# Patient Record
Sex: Female | Born: 1945 | Race: White | Hispanic: No | Marital: Married | Smoking: Former smoker
Health system: Southern US, Community
[De-identification: ages and names within clinical notes are randomized; demographics above are authoritative.]

## PROBLEM LIST (undated history)

## (undated) DIAGNOSIS — C801 Malignant (primary) neoplasm, unspecified: Secondary | ICD-10-CM

## (undated) DIAGNOSIS — J449 Chronic obstructive pulmonary disease, unspecified: Secondary | ICD-10-CM

## (undated) DIAGNOSIS — J42 Unspecified chronic bronchitis: Secondary | ICD-10-CM

## (undated) DIAGNOSIS — F329 Major depressive disorder, single episode, unspecified: Secondary | ICD-10-CM

## (undated) DIAGNOSIS — R0600 Dyspnea, unspecified: Secondary | ICD-10-CM

## (undated) DIAGNOSIS — I10 Essential (primary) hypertension: Secondary | ICD-10-CM

## (undated) DIAGNOSIS — M1990 Unspecified osteoarthritis, unspecified site: Secondary | ICD-10-CM

## (undated) DIAGNOSIS — F32A Depression, unspecified: Secondary | ICD-10-CM

## (undated) DIAGNOSIS — C50919 Malignant neoplasm of unspecified site of unspecified female breast: Secondary | ICD-10-CM

## (undated) HISTORY — PX: SPLENECTOMY, TOTAL: SHX788

## (undated) HISTORY — DX: Depression, unspecified: F32.A

## (undated) HISTORY — PX: ABDOMINAL HYSTERECTOMY: SHX81

## (undated) HISTORY — PX: CHOLECYSTECTOMY: SHX55

## (undated) HISTORY — PX: KNEE SURGERY: SHX244

## (undated) HISTORY — PX: TONSILLECTOMY: SUR1361

## (undated) HISTORY — DX: Major depressive disorder, single episode, unspecified: F32.9

## (undated) HISTORY — PX: ELBOW LIGAMENT RECONSTRUCTION: SHX6359

## (undated) HISTORY — PX: BREAST SURGERY: SHX581

---

## 2002-11-07 ENCOUNTER — Inpatient Hospital Stay (HOSPITAL_COMMUNITY): Admission: RE | Admit: 2002-11-07 | Discharge: 2002-11-09 | Payer: Self-pay | Admitting: Orthopedic Surgery

## 2008-06-24 ENCOUNTER — Ambulatory Visit: Payer: Self-pay | Admitting: Family Medicine

## 2008-06-24 DIAGNOSIS — D1739 Benign lipomatous neoplasm of skin and subcutaneous tissue of other sites: Secondary | ICD-10-CM | POA: Insufficient documentation

## 2008-06-24 DIAGNOSIS — I10 Essential (primary) hypertension: Secondary | ICD-10-CM | POA: Insufficient documentation

## 2011-01-05 NOTE — Op Note (Signed)
NAME:  Melanie Flores, Melanie Flores                      ACCOUNT NO.:  [id number]   MEDICAL RECORD NO.:  [id number]                   PATIENT TYPE:  INP   LOCATION:  NA                                   FACILITY:  [facility]   PHYSICIAN:  Dionne Ano. Everlene Other, M.D.         DATE OF BIRTH:  01-Nov-1945   DATE OF PROCEDURE:  DATE OF DISCHARGE:                                 OPERATIVE REPORT   PREOPERATIVE DIAGNOSES:  Right elbow terrible triad fracture with radial  head fracture, ulna fracture intra-articularly including the coronoid, and a  dislocated elbow.   POSTOPERATIVE DIAGNOSES:  Right elbow terrible triad fracture with radial  head fracture, ulna fracture intra-articularly including the coronoid, and a  dislocated elbow.   PROCEDURES:  1. Open reduction, right elbow dislocation (terrible triad elbow fracture-     dislocation).  2. Open reduction and internal fixation, right comminuted complex intra-     articular ulna fracture, with coronoid separate fragment noted.  3. Open reduction and internal fixation, radial head fracture, with headless     compression screws.  4. In-situ ulnar nerve decompression and evaluation (cubital tunnel     release).  5. Stress radiography, right elbow.   SURGEON:  Dionne Ano. Amanda Pea, M.D.   ASSISTANT:  Karie Chimera, P.A.-C.   TOURNIQUET TIME:  1 hour 20 minutes.   DRAINS:  None.   ESTIMATED BLOOD LOSS:  Minimal.   COMPLICATIONS:  None.   ANESTHESIA:  General anesthetic.   INDICATION FOR PROCEDURE:  This patient is a 65 year old female who  presented to my office earlier this week.  The patient had fallen at [facility], [place], and sustained a terrible triad elbow fracture (elbow  dislocation, ulna fracture, shaft including a separate coronoid piece, as  well as a radial head fracture).  Following her fracture she was  preliminarily reduced in [facility].  She was told she would require  surgery, and consideration was made for it  to be done in [facility];  however, she elected to come home to her residence and have treatment  performed.  She presented to our office this week.  X-rays taken in our  office showed recurrence of her dislocation.  The previous films from [facility] showed reduction, which was done immediately after splint application.  I counseled the patient in regard to her upper extremity predicament and  discussed with her that this is a very difficult fracture.  Unfortunately,  she has a coronoid fracture which is separate from the ulnar shaft itself,  which is splintered.  She also has a radial head fracture and instability  about the elbow.  I have discussed with her that this will require surgical  intervention in the form of ORIF and possible radial head replacement versus  ORIF of the radial head.  I have gone over with her risks and benefits of  surgery, including risks of infection, bleeding, anesthesia, damage to  normal structures, and failure of surgery to accomplish its intended goals  of relieving symptoms and restoring function.  With this in mind, she  desires to proceed.  All questions have been encouraged and answered  preoperatively.   OPERATIVE FINDINGS:  This patient had a terrible triad elbow fracture-  dislocation.  She underwent ORIF without difficulty.  We had excellent  stability achieved through internal fixation about the ulna and radial head  at the conclusion of the case with a smooth arc of motion and no crepitus.  She will be a candidate for early range of motion as she had secure  fixation.  She will be prophylactically given Indocin 75 mg SR x4 weeks to  prevent heterotopic ossification, as this was a delayed ORIF of a fracture-  dislocation which included the radial head.   DESCRIPTION OF PROCEDURE:  The patient was seen by myself and anesthesia.  She was taken to the operative suite and underwent a smooth induction of  general endotracheal anesthesia.  Ancef  was hung preoperatively.  Once in  the operative suite, she was gently positioned in the semi-lateral position,  a bump was built for her arm, and following this she was prepped and draped  in the usual sterile fashion with Betadine scrub and paint.  Once this was  done, the arm was elevated, the tourniquet was inflated to 275 mmHg, and the  patient then underwent a posterior approach to the elbow.  The patient had a  midline posterior approach created.  This was taken down to the fascia and  following this, an interval between the fascia and subcutaneous plane was  created.  I went over to the lateral side of the elbow to the interval past  the anconeus and ECU to gain access to the radial head at a point in the  case.  Medially this swung over to the medial epicondyle.  Full-thickness  flaps were elevated.  Once this was done, the patient had the ulnar nerve  identified and this underwent an in-situ release.  Thus cubital tunnel  release was performed in situ.  The ulnar nerve was significantly contused  proximal and within the cubital canal.  The two heads of the FCU, cubital  tunnel, and ________ fascia were released as well as portions of the medial  intermuscular septum.  The patient tolerated this well.  The nerve sat  nicely and was protected throughout the case.  Once this was done, attention  was turned toward the ulna fracture.  The patient had meticulous dissection  and evaluation of the fracture.  I very carefully identified the fracture  and the intra-articular component.  The bone giblets in the proximal  radioulnar joint were identified and any loose fragments as well as  cartilage were removed.  The coronoid fracture was identified and a  reduction was accomplished.  Similarly, after combination curette, lavage,  gentle periosteal elevation, and fracture preparation, the ulna shaft was also reduced.  Provisional fixation was applied without difficulty and  following this,  an Acumed ulnar plate was applied.  The patient had screws  engaged in standard AO technique, and I did achieve excellent purchase about  the coronoid fracture.  Following achieving a good fixation and application  of the plate, provisional fixation was removed and I placed the arm through  a range of motion.   I should note that prior to gaining stability about the ulna, the elbow did  have to undergo a reduction procedure as  the radial head was dislocated  posteriorly.  The patient did have eburnation of bone about the capitellum  and scuff injury from the radial head fracture.  This was reduced and during  the preparation of the ulna fracture, the elbow reduction was accomplished  and the radial head placed into a reduced position with the fracture  fragments noted.   Once the ORIF of the ulna had been accomplished to my satisfaction without  difficulty, the patient had the fascial tissue repaired over the plate with  0 Vicryl.  Access was then gained to the radial head between the interval  between the ECU and anconeus.  We very carefully pronated the arm to protect  the radial nerve and following this gained access through the anconeus-ECU  interval to the radial head, which was fractured.  This was a comminuted  fracture.  We very gently and delicately lifted the edges and a somewhat  greater than one-third portion of the radial head fracture which was loose  was then provisionally fixed to the main fragment.  Two small K-wires were  used for this.  Once this was done, the patient then had two headless screws  from [facility] placed.  This was done in standard technique.  The  headless screws were size 16 and 18.  They filled the radial head nicely,  and this was checked under image, of course, to make sure that there were no  protruding ends.  They were inserted within the safe zone; however, being  that they were headless and contained within the bone, this was not a high   concern.  The patient at this point in time had full range of motion  including pronation and supination evaluated.  There was no clicking,  popping, and excellent range of motion with good stability.  I then brought  in the fluoroscopy machine and under fluoroscopy checked the elbow.  It had  good stability with full flexion, extension to 10 degrees, and was noted to  have full pronation and supination with excellent stability.  Stress  radiography revealed that this lady will be a candidate for early range of  motion in my opinion.  Once the patient had this performed, we then deflated  the tourniquet, obtained hemostasis, and copiously irrigated all wounds.  The ECU-anconeus interval was closed nicely with 0 Vicryl, followed by  closure of the subcu with 2-0 Vicryl and closure of the skin edge with  staples.  Marcaine without epinephrine was placed in the wound for postop analgesia.  The patient was then placed in a posterior plaster splint with  the elbow at 90 degrees.  She tolerated the procedure well.  She will be  monitored closely in the postoperative period.   She will be admitted for IV antibiotics, pain control, elevation, finger  range of motion, edema control, and postoperative measures.  Will pursue an  aggressive course at trying to restore this patient's range of motion and  proceed accordingly.  It has been a pleasure to participate in her care.                                               Dionne Ano. Everlene Other, M.D.    Nash Mantis  D:  11/07/2002  T:  11/09/2002  Job:  [id number]   cc:   Almedia Balls. Ranell Patrick, M.D.  [address]  [place]  [place]  [address]  Fax: [phone]   [place], [place] Trilby Drummer, MD, [address]

## 2015-11-09 ENCOUNTER — Encounter: Payer: Self-pay | Admitting: *Deleted

## 2015-11-09 ENCOUNTER — Emergency Department (INDEPENDENT_AMBULATORY_CARE_PROVIDER_SITE_OTHER): Payer: Medicare Other

## 2015-11-09 ENCOUNTER — Emergency Department (INDEPENDENT_AMBULATORY_CARE_PROVIDER_SITE_OTHER)
Admission: EM | Admit: 2015-11-09 | Discharge: 2015-11-09 | Disposition: A | Discharge status: Home / Self Care | Payer: Medicare Other | Source: Home / Self Care | Attending: Family Medicine | Admitting: Family Medicine

## 2015-11-09 DIAGNOSIS — M79672 Pain in left foot: Secondary | ICD-10-CM | POA: Diagnosis not present

## 2015-11-09 DIAGNOSIS — M7732 Calcaneal spur, left foot: Secondary | ICD-10-CM

## 2015-11-09 HISTORY — DX: Malignant (primary) neoplasm, unspecified: C80.1

## 2015-11-09 HISTORY — DX: Essential (primary) hypertension: I10

## 2015-11-09 NOTE — ED Notes (Signed)
Pt c/o LT foot pain, swelling and redness x 1 day. Denies injury.

## 2015-11-09 NOTE — ED Provider Notes (Signed)
CSN: [id number]     Arrival date & time 11/09/15  1143 History   First MD Initiated Contact with Patient 11/09/15 1253     Chief Complaint  Patient presents with  . Foot Pain      HPI Comments: While walking yesterday, patient felt a cramping sensation in the dorsum of her left foot.  Later, she had increased pain that has persisted.  She has also noted swelling and redness in the dorsum of her foot.  She recalls no injury.  No fevers, chills, and sweats.  She feels well otherwise. No past history of gout. She takes HCTZ for hypertension.  Patient is a 70 y.o. female presenting with lower extremity pain. The history is provided by the patient.  Foot Pain This is a new problem. The current episode started yesterday. The problem occurs constantly. The problem has been gradually worsening. Associated symptoms comments: No fevers, chills, and sweats . The symptoms are aggravated by walking. Nothing relieves the symptoms. She has tried nothing for the symptoms.    Past Medical History  Diagnosis Date  . Hypertension   . Cancer ([place])     ovarian, breast   Past Surgical History  Procedure Laterality Date  . Cesarean section    . Splenectomy, total    . Cholecystectomy    . Abdominal hysterectomy     History reviewed. No pertinent family history. Social History  Substance Use Topics  . Smoking status: Current Every Day Smoker -- 0.50 packs/day    Types: Cigarettes  . Smokeless tobacco: None  . Alcohol Use: Yes   OB History    No data available     Review of Systems  All other systems reviewed and are negative.   Allergies  Morphine and related  Home Medications   Prior to Admission medications   Medication Sig Start Date End Date Taking? Authorizing Provider  Cholecalciferol (VITAMIN D-3 PO) Take by mouth.   Yes Historical Provider, MD  hydrochlorothiazide (MICROZIDE) 12.5 MG capsule Take 12.5 mg by mouth daily.   Yes Historical Provider, MD  metoprolol succinate  (TOPROL-XL) 25 MG 24 hr tablet Take 25 mg by mouth daily.   Yes Historical Provider, MD  MULTIPLE VITAMIN PO Take by mouth.   Yes Historical Provider, MD   Meds Ordered and Administered this Visit  Medications - No data to display  BP 129/84 mmHg  Pulse 64  Temp(Src) 98.1 F (36.7 C) (Oral)  Resp 16  Ht 5\' 5"  (1.651 m)  Wt 138 lb (62.596 kg)  BMI 22.96 kg/m2  SpO2 97% No data found.   Physical Exam  Constitutional: She is oriented to person, place, and time. She appears well-developed and well-nourished. No distress.  Eyes: Pupils are equal, round, and reactive to light.  Cardiovascular: Normal heart sounds.   Pulmonary/Chest: Breath sounds normal.  Musculoskeletal:       Left foot: There is decreased range of motion, tenderness, bony tenderness and swelling. There is normal capillary refill, no crepitus and no deformity.       Feet:  Distinct tenderness, swelling, and erythema over the first MTP joint.  Decreased range of motion first MTP joint.  Distal neurovascular function is intact.   Lymphadenopathy:    She has no cervical adenopathy.  Neurological: She is alert and oriented to person, place, and time.  Skin: Skin is warm and dry.  Nursing note and vitals reviewed.   ED Course  Procedures none    Imaging Review Dg Foot  Complete Left  11/09/2015  CLINICAL DATA:  Pain medially for 1 day. EXAM: LEFT FOOT - COMPLETE 3+ VIEW COMPARISON:  None. FINDINGS: Frontal, oblique, and lateral views were obtained. There is no acute fracture or dislocation. There is moderate osteoarthritic change in the first MTP joint with bony overgrowth along the lateral aspect proximal portion of the first proximal phalanx. There is mild osteoarthritic change in the third PIP joint. There is a minimal inferior calcaneal spur. There is a prominent accessory ossicle lateral to the cuboid. No erosive change. 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 No acute fracture or  dislocation. Minimal inferior calcaneal spur. Electronically Signed   By: Lowella Grip III M.D.   On: 11/09/2015 13:35      MDM   1. Left foot pain; suspect acute gout   Patient declines Rx for prednisone. May take Ibuprofen 200mg , 4 tabs every 6 to 8 hours with food. Followup with Family Doctor if not improved in one week.     Kandra Nicolas, MD 11/17/15 1052

## 2015-11-09 NOTE — Discharge Instructions (Signed)
May take Ibuprofen 200mg , 4 tabs every 6 to 8 hours with food.

## 2016-11-16 ENCOUNTER — Emergency Department
Admission: EM | Admit: 2016-11-16 | Discharge: 2016-11-16 | Disposition: A | Discharge status: Home / Self Care | Payer: Medicare Other | Source: Home / Self Care | Attending: Family Medicine | Admitting: Family Medicine

## 2016-11-16 ENCOUNTER — Encounter: Payer: Self-pay | Admitting: *Deleted

## 2016-11-16 DIAGNOSIS — J069 Acute upper respiratory infection, unspecified: Secondary | ICD-10-CM

## 2016-11-16 DIAGNOSIS — B9789 Other viral agents as the cause of diseases classified elsewhere: Secondary | ICD-10-CM

## 2016-11-16 DIAGNOSIS — J9801 Acute bronchospasm: Secondary | ICD-10-CM

## 2016-11-16 HISTORY — DX: Malignant neoplasm of unspecified site of unspecified female breast: C50.919

## 2016-11-16 HISTORY — DX: Chronic obstructive pulmonary disease, unspecified: J44.9

## 2016-11-16 HISTORY — DX: Unspecified chronic bronchitis: J42

## 2016-11-16 MED ORDER — PREDNISONE 20 MG PO TABS: ORAL_TABLET | ORAL | 0 refills | Status: DC

## 2016-11-16 MED ORDER — BENZONATATE 200 MG PO CAPS: ORAL_CAPSULE | ORAL | 0 refills | Status: DC

## 2016-11-16 MED ORDER — IPRATROPIUM-ALBUTEROL 0.5-2.5 (3) MG/3ML IN SOLN
3.0000 mL | Freq: Four times a day (QID) | RESPIRATORY_TRACT | Status: DC
  Administered 2016-11-16: 3 mL via RESPIRATORY_TRACT

## 2016-11-16 MED ORDER — METHYLPREDNISOLONE SODIUM SUCC 125 MG IJ SOLR
80.0000 mg | Freq: Once | INTRAMUSCULAR | Status: AC
  Administered 2016-11-16: 80 mg via INTRAMUSCULAR

## 2016-11-16 NOTE — ED Provider Notes (Signed)
[name]    CSN: [id number] Arrival date & time: 11/16/16  1129     History   Chief Complaint Chief Complaint  Patient presents with  . Cough    HPI Melanie Flores is a 71 y.o. female.   About one week ago patient developed typical cold-like symptoms developing over several days, including mild sore throat, sinus congestion, myalgias, fatigue, and cough.  She has now developed wheezing and increased shortness of breath with activity. She has a history of COPD and chronic bronchitis.   The history is provided by the patient.    Past Medical History:  Diagnosis Date  . Breast cancer ([place])   . Cancer ([place])    ovarian, breast  . Chronic bronchitis ([place])   . COPD (chronic obstructive pulmonary disease) ([place])   . Hypertension     Patient Active Problem List   Diagnosis Date Noted  . 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 HYPERTENSION 06/24/2008    Past Surgical History:  Procedure Laterality Date  . ABDOMINAL HYSTERECTOMY    . BREAST SURGERY    . CESAREAN SECTION    . CHOLECYSTECTOMY    . SPLENECTOMY, TOTAL      OB History    No data available       Home Medications    Prior to Admission medications   Medication Sig Start Date End Date Taking? Authorizing Provider  albuterol (PROVENTIL HFA;VENTOLIN HFA) 108 (90 Base) MCG/ACT inhaler Inhale into the lungs every 6 (six) hours as needed for wheezing or shortness of breath.   Yes Historical Provider, MD  B Complex Vitamins (VITAMIN-B COMPLEX PO) Take by mouth.   Yes Historical Provider, MD  calcium carbonate 1250 MG capsule Take 1,250 mg by mouth 2 (two) times daily with a meal.   Yes Historical Provider, MD  Cholecalciferol (VITAMIN D-3 PO) Take by mouth.   Yes Historical Provider, MD  MELATONIN PO Take by mouth.   Yes Historical Provider, MD  METOPROLOL TARTRATE PO Take by mouth.   Yes Historical Provider, MD  MULTIPLE VITAMIN PO Take by mouth.   Yes Historical Provider, MD  senna (SENOKOT) 8.6 MG  tablet Take 1 tablet by mouth daily.   Yes Historical Provider, MD  tiotropium (SPIRIVA) 18 MCG inhalation capsule Place 18 mcg into inhaler and inhale daily.   Yes Historical Provider, MD  benzonatate (TESSALON) 200 MG capsule Take one cap by mouth at bedtime as needed for cough.  May repeat in 4 to 6 hours 11/16/16   Kandra Nicolas, MD  hydrochlorothiazide (MICROZIDE) 12.5 MG capsule Take 12.5 mg by mouth daily.    Historical Provider, MD  metoprolol succinate (TOPROL-XL) 25 MG 24 hr tablet Take 25 mg by mouth daily.    Historical Provider, MD  predniSONE (DELTASONE) 20 MG tablet Take one tab by mouth twice daily for 5 days, then one daily for 3 days. Take with food. 11/16/16   Kandra Nicolas, MD    Family History History reviewed. No pertinent family history.  Social History Social History  Substance Use Topics  . Smoking status: Current Every Day Smoker    Packs/day: 0.50    Types: Cigarettes  . Smokeless tobacco: Never Used  . Alcohol use Yes     Allergies   Chantix [varenicline]; Levaquin [levofloxacin]; and Morphine and related   Review of Systems Review of Systems + sore throat + cough No pleuritic pain + wheezing + nasal congestion + post-nasal drainage No sinus pain/pressure No  itchy/red eyes No earache No hemoptysis + SOB No fever/chills No nausea No vomiting No abdominal pain No diarrhea No urinary symptoms No skin rash + fatigue + myalgias No headache Used OTC meds without relief   Physical Exam Triage Vital Signs ED Triage Vitals  Enc Vitals Group     BP 11/16/16 1254 (!) 146/87     Pulse Rate 11/16/16 1254 75     Resp 11/16/16 1254 16     Temp 11/16/16 1254 97.9 F (36.6 C)     Temp Source 11/16/16 1254 Oral     SpO2 11/16/16 1254 95 %     Weight 11/16/16 1255 141 lb (64 kg)     Height 11/16/16 1255 5\' 5"  (1.651 m)     Head Circumference --      Peak Flow --      Pain Score 11/16/16 1255 0     Pain Loc --      Pain Edu? --      Excl.  in [place]? --    No data found.   Updated Vital Signs BP (!) 146/87 (BP Location: Left Arm)   Pulse 75   Temp 97.9 F (36.6 C) (Oral)   Resp 16   Ht 5\' 5"  (1.651 m)   Wt 141 lb (64 kg)   SpO2 95%   BMI 23.46 kg/m   Visual Acuity Right Eye Distance:   Left Eye Distance:   Bilateral Distance:    Right Eye Near:   Left Eye Near:    Bilateral Near:     Physical Exam Nursing notes and Vital Signs reviewed. Appearance:  Patient appears stated age, and in no acute distress Eyes:  Pupils are equal, round, and reactive to light and accomodation.  Extraocular movement is intact.  Conjunctivae are not inflamed  Ears:  Canals normal.  Tympanic membranes normal.  Nose:  Mildly congested turbinates.  No sinus tenderness.  Pharynx:  Normal Neck:  Supple.  Tender enlarged posterior/lateral nodes are palpated bilaterally  Lungs:  Bibasilar expiratory wheezes present.  Breath sounds are equal.  Moving air well. Heart:  Regular rate and rhythm without murmurs, rubs, or gallops.  Abdomen:  Nontender without masses or hepatosplenomegaly.  Bowel sounds are present.  No CVA or flank tenderness.  Extremities:  No edema.  Skin:  No rash present.    UC Treatments / Results  Labs (all labs ordered are listed, but only abnormal results are displayed) Labs Reviewed - No data to display  EKG  EKG Interpretation None       Radiology No results found.  Procedures Procedures (including critical care time)  Medications Ordered in UC Medications  ipratropium-albuterol (DUONEB) 0.5-2.5 (3) MG/3ML nebulizer solution 3 mL (3 mLs Nebulization Given 11/16/16 1318)  methylPREDNISolone sodium succinate (SOLU-MEDROL) 125 mg/2 mL injection 80 mg (not administered)     Initial Impression / Assessment and Plan / UC Course  I have reviewed the triage vital signs and the nursing notes.  Pertinent labs & imaging results that were available during my care of the patient were reviewed by me and  considered in my medical decision making (see chart for details).    Administered DuoNeb by hand held nebulizer  Administered Solumedrol 80mg  IM. Prescription written for Benzonatate [facility]) to take at bedtime for night-time cough.  Begin prednisone burst/taper tomorrow (Saturday, 11/17/16). Begin doxycycline 100mg  BID. Continue Spiriva and albuterol inhalers. Take plain guaifenesin (1200mg  extended release tabs such as Mucinex) twice daily, with plenty of  water, for cough and congestion.  Get adequate rest.   May use Afrin nasal spray (or generic oxymetazoline) each morning for about 5 days and then discontinue.  Also recommend using saline nasal spray several times daily and saline nasal irrigation (AYR is a common brand).  Use Flonase nasal spray each morning after using Afrin nasal spray and saline nasal irrigation. Try warm salt water gargles for sore throat.  Stop all antihistamines for now, and other non-prescription cough/cold preparations.   Follow-up with family doctor if not improving about 7 to10 days.    Final Clinical Impressions(s) / UC Diagnoses   Final diagnoses:  Viral URI with cough  Bronchospasm, acute    New Prescriptions New Prescriptions   BENZONATATE (TESSALON) 200 MG CAPSULE    Take one cap by mouth at bedtime as needed for cough.  May repeat in 4 to 6 hours   PREDNISONE (DELTASONE) 20 MG TABLET    Take one tab by mouth twice daily for 5 days, then one daily for 3 days. Take with food.     Kandra Nicolas, MD 11/26/16 1308

## 2016-11-16 NOTE — ED Triage Notes (Signed)
Pt c/o productive cough x 1 wk, with wheezing x 1 day. Denies fever. She has taken Mucinex, [employer], and [employer] Hx of COPD and Chronic Bronchitis.

## 2016-11-16 NOTE — Discharge Instructions (Signed)
Begin prednisone tomorrow (Saturday, 11/17/16). Begin doxycycline 100mg  BID. Continue Spiriva and albuterol inhalers. Take plain guaifenesin (1200mg  extended release tabs such as Mucinex) twice daily, with plenty of water, for cough and congestion.  Get adequate rest.   May use Afrin nasal spray (or generic oxymetazoline) each morning for about 5 days and then discontinue.  Also recommend using saline nasal spray several times daily and saline nasal irrigation (AYR is a common brand).  Use Flonase nasal spray each morning after using Afrin nasal spray and saline nasal irrigation. Try warm salt water gargles for sore throat.  Stop all antihistamines for now, and other non-prescription cough/cold preparations.   Follow-up with family doctor if not improving about 7 to10 days.

## 2017-03-24 ENCOUNTER — Encounter: Payer: Self-pay | Admitting: Emergency Medicine

## 2017-03-24 ENCOUNTER — Emergency Department
Admission: EM | Admit: 2017-03-24 | Discharge: 2017-03-24 | Disposition: A | Discharge status: Home / Self Care | Payer: Medicare Other | Source: Home / Self Care | Attending: Family Medicine | Admitting: Family Medicine

## 2017-03-24 DIAGNOSIS — L304 Erythema intertrigo: Secondary | ICD-10-CM | POA: Diagnosis not present

## 2017-03-24 MED ORDER — FUROSEMIDE 20 MG PO TABS: ORAL_TABLET | ORAL | 0 refills | Status: DC

## 2017-03-24 MED ORDER — BUTALBITAL-APAP-CAFFEINE 50-325-40 MG PO TABS
1.0000 | ORAL_TABLET | Freq: Four times a day (QID) | ORAL | 0 refills | Status: DC | PRN
Start: 2017-03-24 — End: 2017-03-24

## 2017-03-24 MED ORDER — KETOCONAZOLE 2 % EX CREA: 1.0000 "application " | TOPICAL_CREAM | Freq: Two times a day (BID) | CUTANEOUS | 0 refills | Status: DC

## 2017-03-24 NOTE — ED Triage Notes (Signed)
Patient presents to [facility] with complaint of rash under Right Breast, patient states that it feels like a poking pain

## 2017-03-24 NOTE — Discharge Instructions (Signed)
May apply 1% hydrocortisone cream two or three times daily as needed for itching.

## 2017-03-24 NOTE — ED Provider Notes (Signed)
[name]    CSN: [id number] Arrival date & time: 03/24/17  1114     History   Chief Complaint Chief Complaint  Patient presents with  . Rash    HPI Melanie Flores is a 71 y.o. female.   Patient complains of pruritic rash under right breast for two days, mildly painful.  She has been applying nystatin cream.   The history is provided by the patient.  Rash  Location: beneath right breast. Quality: dryness, itchiness, painful and redness   Quality: not blistering, not bruising, not burning, not draining, not peeling, not scaling, not swelling and not weeping   Pain details:    Quality:  Aching and itching   Severity:  Mild   Onset quality:  Gradual   Duration:  2 days   Timing:  Constant   Progression:  Worsening Severity:  Mild Onset quality:  Gradual Duration:  2 days Timing:  Constant Progression:  Worsening Chronicity:  New Context: not animal contact, not chemical exposure, not exposure to similar rash, not food, not hot tub use, not insect bite/sting, not medications, not new detergent/soap and not plant contact   Relieved by:  Nothing Worsened by:  Nothing Ineffective treatments:  Anti-fungal cream Associated symptoms: fatigue and myalgias   Associated symptoms: no abdominal pain, no diarrhea, no fever, no headaches, no induration, no joint pain, no nausea, no shortness of breath, no sore throat and no URI     Past Medical History:  Diagnosis Date  . Breast cancer ([place])   . Cancer ([place])    ovarian, breast  . Chronic bronchitis ([place])   . COPD (chronic obstructive pulmonary disease) ([place])   . Hypertension     Patient Active Problem List   Diagnosis Date Noted  . 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 HYPERTENSION 06/24/2008    Past Surgical History:  Procedure Laterality Date  . ABDOMINAL HYSTERECTOMY    . BREAST SURGERY    . CESAREAN SECTION    . CHOLECYSTECTOMY    . SPLENECTOMY, TOTAL      OB History    No data available        Home Medications    Prior to Admission medications   Medication Sig Start Date End Date Taking? Authorizing Provider  albuterol (PROVENTIL HFA;VENTOLIN HFA) 108 (90 Base) MCG/ACT inhaler Inhale into the lungs every 6 (six) hours as needed for wheezing or shortness of breath.    [provider]  B Complex Vitamins (VITAMIN-B COMPLEX PO) Take by mouth.    [provider]  benzonatate (TESSALON) 200 MG capsule Take one cap by mouth at bedtime as needed for cough.  May repeat in 4 to 6 hours 11/16/16   Kandra Nicolas, MD  calcium carbonate 1250 MG capsule Take 1,250 mg by mouth 2 (two) times daily with a meal.    [provider]  Cholecalciferol (VITAMIN D-3 PO) Take by mouth.    [provider]  hydrochlorothiazide (MICROZIDE) 12.5 MG capsule Take 12.5 mg by mouth daily.    [provider]  MELATONIN PO Take by mouth.    [provider]  metoprolol succinate (TOPROL-XL) 25 MG 24 hr tablet Take 25 mg by mouth daily.    [provider]  METOPROLOL TARTRATE PO Take by mouth.    [provider]  MULTIPLE VITAMIN PO Take by mouth.    [provider]  predniSONE (DELTASONE) 20 MG tablet Take one tab by mouth twice daily for 5  days, then one daily for 3 days. Take with food. 11/16/16   Kandra Nicolas, MD  senna (SENOKOT) 8.6 MG tablet Take 1 tablet by mouth daily.    [provider]  tiotropium (SPIRIVA) 18 MCG inhalation capsule Place 18 mcg into inhaler and inhale daily.    [provider]    Family History History reviewed. No pertinent family history.  Social History Social History  Substance Use Topics  . Smoking status: Current Every Day Smoker    Packs/day: 0.50    Types: Cigarettes  . Smokeless tobacco: Never Used  . Alcohol use Yes     Comment: 1/2 case per week      Allergies   Chantix [varenicline]; Levaquin [levofloxacin]; and Morphine and related   Review of  Systems Review of Systems  Constitutional: Positive for fatigue. Negative for fever.  HENT: Negative for sore throat.   Respiratory: Negative for shortness of breath.   Gastrointestinal: Negative for abdominal pain, diarrhea and nausea.  Musculoskeletal: Positive for myalgias. Negative for arthralgias.  Skin: Positive for rash.  Neurological: Negative for headaches.  All other systems reviewed and are negative.    Physical Exam Triage Vital Signs ED Triage Vitals [03/24/17 1138]  Enc Vitals Group     BP (!) 158/87     Pulse Rate (!) 16     Resp 16     Temp 98.1 F (36.7 C)     Temp Source Oral     SpO2 97 %     Weight 142 lb 8 oz (64.6 kg)     Height      Head Circumference      Peak Flow      Pain Score 7     Pain Loc      Pain Edu?      Excl. in [place]?    No data found.   Updated Vital Signs BP (!) 158/87 (BP Location: Left Arm)   Pulse (!) 16   Temp 98.1 F (36.7 C) (Oral)   Resp 16   Wt 142 lb 8 oz (64.6 kg)   SpO2 97%   BMI 23.71 kg/m   Visual Acuity Right Eye Distance:   Left Eye Distance:   Bilateral Distance:    Right Eye Near:   Left Eye Near:    Bilateral Near:     Physical Exam  Constitutional: She appears well-developed and well-nourished.  HENT:  Head: Normocephalic.  Eyes: Pupils are equal, round, and reactive to light.  Cardiovascular: Normal rate.   Pulmonary/Chest: Effort normal. She exhibits no tenderness.    In the intertriginous area beneath right breast there is macular erythema with well defined margin as noted on diagram.  No warmth or tenderness to palpation.    Neurological: She is alert.  Skin: Skin is warm and dry.  Nursing note and vitals reviewed.    UC Treatments / Results  Labs (all labs ordered are listed, but only abnormal results are displayed) Labs Reviewed -   POCT KOH prep:  Fungal elements present  EKG  EKG Interpretation None       Radiology No results found.  Procedures Procedures   Performed skin scraping:  With sterile technique, used #18 gage needle to scrape margin of lesion for microscopic exam.  Medications Ordered in UC Medications - No data to display   Initial Impression / Assessment and Plan / UC Course  I have reviewed the triage vital signs and the nursing notes.  Pertinent  labs & imaging results that were available during my care of the patient were reviewed by me and considered in my medical decision making (see chart for details).    Begin Nizoral cream BID. May apply 1% hydrocortisone cream two or three times daily as needed for itching. Followup with dermatologist if not resolved about 10 days.    Final Clinical Impressions(s) / UC Diagnoses   Final diagnoses:  Intertrigo    New Prescriptions Current Discharge Medication List       Kandra Nicolas, MD 04/05/17 [phone]

## 2017-03-27 ENCOUNTER — Telehealth: Payer: Self-pay

## 2017-03-27 NOTE — Telephone Encounter (Signed)
Left VM to call UC if any questions or concerns.

## 2017-09-04 ENCOUNTER — Other Ambulatory Visit: Payer: Self-pay

## 2017-09-04 ENCOUNTER — Emergency Department
Admission: EM | Admit: 2017-09-04 | Discharge: 2017-09-04 | Disposition: A | Discharge status: Home / Self Care | Payer: Medicare Other | Source: Home / Self Care | Attending: Family Medicine | Admitting: Family Medicine

## 2017-09-04 ENCOUNTER — Encounter: Payer: Self-pay | Admitting: Emergency Medicine

## 2017-09-04 DIAGNOSIS — H5789 Other specified disorders of eye and adnexa: Secondary | ICD-10-CM | POA: Diagnosis not present

## 2017-09-04 DIAGNOSIS — J069 Acute upper respiratory infection, unspecified: Secondary | ICD-10-CM

## 2017-09-04 MED ORDER — OLOPATADINE HCL 0.1 % OP SOLN: 1.0000 [drp] | Freq: Two times a day (BID) | OPHTHALMIC | 0 refills | Status: DC

## 2017-09-04 NOTE — ED Provider Notes (Addendum)
[name]    CSN: [id number] Arrival date & time: 09/04/17  0932     History   Chief Complaint Chief Complaint  Patient presents with  . Eye Problem    HPI Melanie Flores is a 72 y.o. female.   HPI  Melanie Flores is a 72 y.o. female presenting to UC with c/o bilateral eye itching, burning and watering for 3 days with associated nasal congestion and mild SOB. Hx of COPD. She uses her [employer] daily but has not tried her albuterol inhaler. She had some chills yesterday but no recorded fever.  Denies n/v/d. She has not tried anything for her eye symptoms. Her husband is also at [facility] to be seen for eye irritation.    Past Medical History:  Diagnosis Date  . Breast cancer ([place])   . Cancer ([place])    ovarian, breast  . Chronic bronchitis ([place])   . COPD (chronic obstructive pulmonary disease) ([place])   . Hypertension     Patient Active Problem List   Diagnosis Date Noted  . 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 HYPERTENSION 06/24/2008    Past Surgical History:  Procedure Laterality Date  . ABDOMINAL HYSTERECTOMY    . BREAST SURGERY    . CESAREAN SECTION    . CHOLECYSTECTOMY    . SPLENECTOMY, TOTAL      OB History    No data available       Home Medications    Prior to Admission medications   Medication Sig Start Date End Date Taking? Authorizing Provider  albuterol (PROVENTIL HFA;VENTOLIN HFA) 108 (90 Base) MCG/ACT inhaler Inhale into the lungs every 6 (six) hours as needed for wheezing or shortness of breath.    [provider]  B Complex Vitamins (VITAMIN-B COMPLEX PO) Take by mouth.    [provider]  benzonatate (TESSALON) 200 MG capsule Take one cap by mouth at bedtime as needed for cough.  May repeat in 4 to 6 hours 11/16/16   Kandra Nicolas, MD  calcium carbonate 1250 MG capsule Take 1,250 mg by mouth 2 (two) times daily with a meal.    [provider]  Cholecalciferol (VITAMIN D-3 PO) Take by mouth.    [provider]  hydrochlorothiazide (MICROZIDE) 12.5 MG capsule Take 12.5 mg by mouth daily.    [provider]  ketoconazole (NIZORAL) 2 % cream Apply 1 application topically 2 (two) times daily. 03/24/17   Kandra Nicolas, MD  MELATONIN PO Take by mouth.    [provider]  metoprolol succinate (TOPROL-XL) 25 MG 24 hr tablet Take 25 mg by mouth daily.    [provider]  METOPROLOL TARTRATE PO Take by mouth.    [provider]  MULTIPLE VITAMIN PO Take by mouth.    [provider]  olopatadine (PATANOL) 0.1 % ophthalmic solution Place 1 drop into both eyes 2 (two) times daily. 09/04/17   Noe Gens, PA-C  senna (SENOKOT) 8.6 MG tablet Take 1 tablet by mouth daily.    [provider]  tiotropium (SPIRIVA) 18 MCG inhalation capsule Place 18 mcg into inhaler and inhale daily.    [provider]    Family History No family history on file.  Social History Social History   Tobacco Use  . Smoking status: Current Every Day Smoker    Packs/day: 0.50    Types: Cigarettes  . Smokeless tobacco: Never Used  Substance Use Topics  . Alcohol use: Yes  Comment: 1/2 case per week   . Drug use: No     Allergies   Chantix [varenicline]; Levaquin [levofloxacin]; and Morphine and related   Review of Systems Review of Systems  Constitutional: Positive for chills. Negative for fever.  HENT: Positive for congestion, postnasal drip and rhinorrhea ( mild). Negative for sneezing and sore throat.   Eyes: Positive for pain, discharge (small amount of crusty in the morning), redness and itching.  Respiratory: Positive for cough ( minimal). Negative for shortness of breath.   Neurological: Negative for dizziness, light-headedness and headaches.     Physical Exam Triage Vital Signs ED Triage Vitals  Enc Vitals Group     BP 09/04/17 1002 (!) 170/79     Pulse Rate 09/04/17 1002 61     Resp --      Temp 09/04/17 1002 97.8 F (36.6  C)     Temp Source 09/04/17 1002 Oral     SpO2 09/04/17 1002 99 %     Weight 09/04/17 1003 141 lb (64 kg)     Height 09/04/17 1003 5\' 4"  (1.626 m)     Head Circumference --      Peak Flow --      Pain Score 09/04/17 1003 3     Pain Loc --      Pain Edu? --      Excl. in [place]? --    No data found.  Updated Vital Signs BP (!) 170/79 (BP Location: Right Arm)   Pulse 61   Temp 97.8 F (36.6 C) (Oral)   Ht 5\' 4"  (1.626 m)   Wt 141 lb (64 kg)   SpO2 99%   BMI 24.20 kg/m   Visual Acuity Right Eye Distance: 20/25 Left Eye Distance: 20/100 Bilateral Distance: 20/25(without correction)  Right Eye Near:   Left Eye Near:    Bilateral Near:     Physical Exam  Constitutional: She is oriented to person, place, and time. She appears well-developed and well-nourished. No distress.  HENT:  Head: Normocephalic and atraumatic.  Right Ear: Tympanic membrane normal.  Left Ear: Tympanic membrane normal.  Nose: Nose normal.  Mouth/Throat: Uvula is midline, oropharynx is clear and moist and mucous membranes are normal.  Eyes: Conjunctivae, EOM and lids are normal. Pupils are equal, round, and reactive to light.  Neck: Normal range of motion. Neck supple.  Cardiovascular: Normal rate and regular rhythm.  Pulmonary/Chest: Effort normal and breath sounds normal. No stridor. No respiratory distress. She has no wheezes. She has no rales.  Musculoskeletal: Normal range of motion.  Neurological: She is alert and oriented to person, place, and time.  Skin: Skin is warm and dry. She is not diaphoretic.  Psychiatric: She has a normal mood and affect. Her behavior is normal.  Nursing note and vitals reviewed.    UC Treatments / Results  Labs (all labs ordered are listed, but only abnormal results are displayed) Labs Reviewed - No data to display  EKG  EKG Interpretation None       Radiology No results found.  Procedures Procedures (including critical care time)  Medications Ordered  in UC Medications - No data to display   Initial Impression / Assessment and Plan / UC Course  I have reviewed the triage vital signs and the nursing notes.  Pertinent labs & imaging results that were available during my care of the patient were reviewed by me and considered in my medical decision making (see chart for details).  Left eye visual acuity: 20/100 (due to surgery, "one is far and one is near") Hx and exam c/w viral URI No evidence of bacterial conjunctivitis Will tx symptomatically  F/u with PCP in 1 week if not improving.   Final Clinical Impressions(s) / UC Diagnoses   Final diagnoses:  Eye irritation  Viral upper respiratory illness    ED Discharge Orders        Ordered    olopatadine (PATANOL) 0.1 % ophthalmic solution  2 times daily     09/04/17 1022       Controlled Substance Prescriptions [place] Controlled Substance Registry consulted? Not Applicable   Tyrell Antonio 09/04/17 1112    Noe Gens, [place] 09/04/17 1113

## 2017-09-04 NOTE — ED Triage Notes (Signed)
Bi-lateral eyes burning, itching, watery, stinging x 3 days

## 2017-12-05 ENCOUNTER — Other Ambulatory Visit: Payer: Self-pay

## 2017-12-05 ENCOUNTER — Emergency Department
Admission: EM | Admit: 2017-12-05 | Discharge: 2017-12-05 | Disposition: A | Discharge status: Home / Self Care | Payer: Medicare Other | Source: Home / Self Care | Attending: Emergency Medicine | Admitting: Emergency Medicine

## 2017-12-05 DIAGNOSIS — R21 Rash and other nonspecific skin eruption: Secondary | ICD-10-CM

## 2017-12-05 MED ORDER — BETAMETHASONE DIPROPIONATE AUG 0.05 % EX CREA: TOPICAL_CREAM | Freq: Two times a day (BID) | CUTANEOUS | 0 refills | Status: DC

## 2017-12-05 NOTE — ED Provider Notes (Signed)
[facility]    CSN: [id number] Arrival date & time: 12/05/17  0800     History   Chief Complaint Chief Complaint  Patient presents with  . Rash    HPI Melanie Flores is a 72 y.o. female.  Patient enters with a Cheek complaint of a rash in the center of her chest.  This has been happening since 2012.  Every 3-4 months she develops a red raised rash in the center of her chest and at times beneath her breasts.  This area is extremely pruritic.  She tried Nizoral cream without improvement.  She does not have a history of diabetes.  She has not been to the dermatologist. HPI  Past Medical History:  Diagnosis Date  . Breast cancer ([place])   . Cancer ([place])    ovarian, breast  . Chronic bronchitis ([place])   . COPD (chronic obstructive pulmonary disease) ([place])   . Hypertension     Patient Active Problem List   Diagnosis Date Noted  . 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 HYPERTENSION 06/24/2008    Past Surgical History:  Procedure Laterality Date  . ABDOMINAL HYSTERECTOMY    . BREAST SURGERY    . CESAREAN SECTION    . CHOLECYSTECTOMY    . SPLENECTOMY, TOTAL      OB History   None      Home Medications    Prior to Admission medications   Medication Sig Start Date End Date Taking? Authorizing Provider  albuterol (PROVENTIL HFA;VENTOLIN HFA) 108 (90 Base) MCG/ACT inhaler Inhale into the lungs every 6 (six) hours as needed for wheezing or shortness of breath.    [provider]  B Complex Vitamins (VITAMIN-B COMPLEX PO) Take by mouth.    [provider]  benzonatate (TESSALON) 200 MG capsule Take one cap by mouth at bedtime as needed for cough.  May repeat in 4 to 6 hours 11/16/16   Kandra Nicolas, MD  calcium carbonate 1250 MG capsule Take 1,250 mg by mouth 2 (two) times daily with a meal.    [provider]  Cholecalciferol (VITAMIN D-3 PO) Take by mouth.    [provider]  hydrochlorothiazide (MICROZIDE) 12.5 MG capsule Take  12.5 mg by mouth daily.    [provider]  ketoconazole (NIZORAL) 2 % cream Apply 1 application topically 2 (two) times daily. 03/24/17   Kandra Nicolas, MD  MELATONIN PO Take by mouth.    [provider]  metoprolol succinate (TOPROL-XL) 25 MG 24 hr tablet Take 25 mg by mouth daily.    [provider]  METOPROLOL TARTRATE PO Take by mouth.    [provider]  MULTIPLE VITAMIN PO Take by mouth.    [provider]  olopatadine (PATANOL) 0.1 % ophthalmic solution Place 1 drop into both eyes 2 (two) times daily. 09/04/17   Noe Gens, PA-C  senna (SENOKOT) 8.6 MG tablet Take 1 tablet by mouth daily.    [provider]  tiotropium (SPIRIVA) 18 MCG inhalation capsule Place 18 mcg into inhaler and inhale daily.    [provider]    Family History History reviewed. No pertinent family history.  Social History Social History   Tobacco Use  . Smoking status: Current Every Day Smoker    Packs/day: 0.50    Types: Cigarettes  . Smokeless tobacco: Never Used  Substance Use Topics  . Alcohol use: Yes    Comment: 1/2 case per week   . Drug  use: No     Allergies   Chantix [varenicline]; Levaquin [levofloxacin]; and Morphine and related   Review of Systems Review of Systems  Constitutional: Negative.   Eyes: Negative.   Skin:       Recurrent rash between her breasts.     Physical Exam Triage Vital Signs ED Triage Vitals  Enc Vitals Group     BP 12/05/17 0825 140/89     Pulse Rate 12/05/17 0825 63     Resp --      Temp 12/05/17 0825 97.8 F (36.6 C)     Temp Source 12/05/17 0825 Oral     SpO2 12/05/17 0825 99 %     Weight 12/05/17 0826 139 lb (63 kg)     Height 12/05/17 0826 5\' 5"  (1.651 m)     Head Circumference --      Peak Flow --      Pain Score 12/05/17 0826 0     Pain Loc --      Pain Edu? --      Excl. in [place]? --    No data found.  Updated Vital Signs BP 140/89 (BP Location: Right Arm)   Pulse 63    Temp 97.8 F (36.6 C) (Oral)   Ht 5\' 5"  (1.651 m)   Wt 139 lb (63 kg)   SpO2 99%   BMI 23.13 kg/m   Visual Acuity Right Eye Distance:   Left Eye Distance:   Bilateral Distance:    Right Eye Near:   Left Eye Near:    Bilateral Near:     Physical Exam  Skin:  There is a maculopapular slightly raised rash between her breasts.  This does not extend into the axilla.  This did not fluoresce with Sherral Hammers lamp evaluation.     UC Treatments / Results  Labs (all labs ordered are listed, but only abnormal results are displayed) Labs Reviewed - No data to display  EKG None Radiology No results found.  Procedures Procedures (including critical care time)  Medications Ordered in UC Medications - No data to display   Initial Impression / Assessment and Plan / UC Course  I have reviewed the triage vital signs and the nursing notes.  Pertinent labs & imaging results that were available during my care of the patient were reviewed by me and considered in my medical decision making (see chart for details).     I am not clear what this rash is.  It actually has the appearance of a contact dermatitis but she does not wear an underwire to her bra.  She did not respond to antifungal treatments.  It did not have the classic appearance of yeast.  It also did not fluoresce with the [place] lamp that would say it is erythrasma.  I advised her to be sure her bras did not have an underwire.  She will apply hydrocortisone cream 3 times a day.  She would take Benadryl at night and Claritin or Zyrtec 1 every morning for itching.  I have advised her to follow-up with the dermatologist.  Final Clinical Impressions(s) / UC Diagnoses   Final diagnoses:  None    ED Discharge Orders    None     Apply cream 3 times a day. I would advise you to see the dermatologist for consideration of a skin biopsy to get a definite diagnosis of your recurrent rash. Take Benadryl at night for itching. Take  Claritin 1 every morning to help with the  itching. Be sure that the bras you wear do not have an underwire. When possible would advise wearing white cotton tops.  Controlled Substance Prescriptions [place] Controlled Substance Registry consulted? Not Applicable   Darlyne Russian, MD 12/05/17 [phone]

## 2017-12-05 NOTE — Discharge Instructions (Addendum)
Apply cream 3 times a day. I would advise you to see the dermatologist for consideration of a skin biopsy to get a definite diagnosis of your recurrent rash. Take Benadryl at night for itching. Take Claritin 1 every morning to help with the itching. Be sure that the bras you wear do not have an underwire. When possible would advise wearing white cotton tops.

## 2017-12-05 NOTE — ED Triage Notes (Signed)
Pt has had a rash off and on under her breast.  The last one started this past Friday.

## 2017-12-28 ENCOUNTER — Emergency Department (INDEPENDENT_AMBULATORY_CARE_PROVIDER_SITE_OTHER)
Admission: EM | Admit: 2017-12-28 | Discharge: 2017-12-28 | Disposition: A | Discharge status: Home / Self Care | Payer: Medicare Other | Source: Home / Self Care | Attending: Family Medicine | Admitting: Family Medicine

## 2017-12-28 ENCOUNTER — Encounter: Payer: Self-pay | Admitting: Emergency Medicine

## 2017-12-28 DIAGNOSIS — R310 Gross hematuria: Secondary | ICD-10-CM

## 2017-12-28 LAB — POCT CBC W AUTO DIFF (K'VILLE URGENT CARE)

## 2017-12-28 LAB — POCT URINALYSIS DIP (MANUAL ENTRY)
BILIRUBIN UA: NEGATIVE mg/dL
Glucose, UA: NEGATIVE mg/dL
LEUKOCYTES UA: NEGATIVE
NITRITE UA: NEGATIVE
Spec Grav, UA: 1.025 (ref 1.010–1.025)
Urobilinogen, UA: 0.2 E.U./dL
pH, UA: 7 (ref 5.0–8.0)

## 2017-12-28 MED ORDER — CEPHALEXIN 500 MG PO CAPS: 500.0000 mg | ORAL_CAPSULE | Freq: Two times a day (BID) | ORAL | 0 refills | Status: DC

## 2017-12-28 NOTE — ED Provider Notes (Signed)
[name]    CSN: [id number] Arrival date & time: 12/28/17  1509     History   Chief Complaint Chief Complaint  Patient presents with  . Hematuria    HPI Melanie Flores is a 72 y.o. female.   Patient noticed blood in her urine yesterday, becoming more pronounced today.  She has noticed a vague mild ache in her right back, and mild pelvic pressure, but no dysuria or frequency.  She has had no fevers, chills, and sweats and no nausea/vomiting.  She finished a course of doxycycline 1.5 weeks ago for bronchitis, and feels well otherwise.  No history of kidney stones.   She has a follow-up appointment with her PCP in two days.  The history is provided by the patient.  Hematuria  This is a new problem. The current episode started yesterday. The problem occurs constantly. The problem has been gradually worsening. Pertinent negatives include no chest pain and no abdominal pain. Nothing aggravates the symptoms. Nothing relieves the symptoms. She has tried nothing for the symptoms.    Past Medical History:  Diagnosis Date  . Breast cancer ([place])   . Cancer ([place])    ovarian, breast  . Chronic bronchitis ([place])   . COPD (chronic obstructive pulmonary disease) ([place])   . Hypertension     Patient Active Problem List   Diagnosis Date Noted  . 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 HYPERTENSION 06/24/2008    Past Surgical History:  Procedure Laterality Date  . ABDOMINAL HYSTERECTOMY    . BREAST SURGERY    . CESAREAN SECTION    . CHOLECYSTECTOMY    . SPLENECTOMY, TOTAL      OB History   None      Home Medications    Prior to Admission medications   Medication Sig Start Date End Date Taking? Authorizing Provider  albuterol (PROVENTIL HFA;VENTOLIN HFA) 108 (90 Base) MCG/ACT inhaler Inhale into the lungs every 6 (six) hours as needed for wheezing or shortness of breath.    [provider]  augmented betamethasone dipropionate (DIPROLENE AF) 0.05 % cream Apply  topically 2 (two) times daily. 12/05/17   Darlyne Russian, MD  B Complex Vitamins (VITAMIN-B COMPLEX PO) Take by mouth.    [provider]  benzonatate (TESSALON) 200 MG capsule Take one cap by mouth at bedtime as needed for cough.  May repeat in 4 to 6 hours 11/16/16   Kandra Nicolas, MD  calcium carbonate 1250 MG capsule Take 1,250 mg by mouth 2 (two) times daily with a meal.    [provider]  cephALEXin (KEFLEX) 500 MG capsule Take 1 capsule (500 mg total) by mouth 2 (two) times daily. 12/28/17   Kandra Nicolas, MD  Cholecalciferol (VITAMIN D-3 PO) Take by mouth.    [provider]  hydrochlorothiazide (MICROZIDE) 12.5 MG capsule Take 12.5 mg by mouth daily.    [provider]  ketoconazole (NIZORAL) 2 % cream Apply 1 application topically 2 (two) times daily. 03/24/17   Kandra Nicolas, MD  MELATONIN PO Take by mouth.    [provider]  metoprolol succinate (TOPROL-XL) 25 MG 24 hr tablet Take 25 mg by mouth daily.    [provider]  METOPROLOL TARTRATE PO Take by mouth.    [provider]  MULTIPLE VITAMIN PO Take by mouth.    [provider]  olopatadine (PATANOL) 0.1 % ophthalmic solution Place 1 drop into both eyes 2 (two) times daily. 09/04/17  Noe Gens, PA-C  senna (SENOKOT) 8.6 MG tablet Take 1 tablet by mouth daily.    [provider]  tiotropium (SPIRIVA) 18 MCG inhalation capsule Place 18 mcg into inhaler and inhale daily.    [provider]    Family History History reviewed. No pertinent family history.  Social History Social History   Tobacco Use  . Smoking status: Current Every Day Smoker    Packs/day: 0.50    Types: Cigarettes  . Smokeless tobacco: Never Used  Substance Use Topics  . Alcohol use: Yes    Comment: 1/2 case per week   . Drug use: No     Allergies   Chantix [varenicline]; Levaquin [levofloxacin]; and Morphine and related   Review of Systems Review  of Systems  Cardiovascular: Negative for chest pain.  Gastrointestinal: Negative for abdominal pain.  Genitourinary: Positive for hematuria.  All other systems reviewed and are negative.    Physical Exam Triage Vital Signs ED Triage Vitals  Enc Vitals Group     BP 12/28/17 1625 (!) 153/90     Pulse Rate 12/28/17 1625 68     Resp --      Temp 12/28/17 1625 98.2 F (36.8 C)     Temp src --      SpO2 12/28/17 1625 95 %     Weight 12/28/17 1626 138 lb (62.6 kg)     Height --      Head Circumference --      Peak Flow --      Pain Score 12/28/17 1626 0     Pain Loc --      Pain Edu? --      Excl. in [place]? --    No data found.  Updated Vital Signs BP (!) 153/90 (BP Location: Right Arm)   Pulse 68   Temp 98.2 F (36.8 C)   Wt 138 lb (62.6 kg)   SpO2 95%   BMI 22.96 kg/m   Visual Acuity Right Eye Distance:   Left Eye Distance:   Bilateral Distance:    Right Eye Near:   Left Eye Near:    Bilateral Near:     Physical Exam Nursing notes and Vital Signs reviewed. Appearance:  Patient appears stated age, and in no acute distress.    Eyes:  Pupils are equal, round, and reactive to light and accomodation.  Extraocular movement is intact.  Conjunctivae are not inflamed   Pharynx:  Normal; moist mucous membranes  Neck:  Supple.  No adenopathy Lungs:  Clear to auscultation.  Breath sounds are equal.  Moving air well. Heart:  Regular rate and rhythm without murmurs, rubs, or gallops.  Abdomen:  Nontender without masses or hepatosplenomegaly.  Bowel sounds are present.  No CVA or flank tenderness.  Extremities:  No edema.  Skin:  No rash present.     UC Treatments / Results  Labs (all labs ordered are listed, but only abnormal results are displayed) Labs Reviewed  POCT URINALYSIS DIP (MANUAL ENTRY) - Abnormal; Notable for the following components:      Result Value   Color, UA red (*)    Clarity, UA cloudy (*)    Bilirubin, UA small (*)    Blood, UA large (*)     Protein Ur, POC trace (*)    All other components within normal limits  URINE CULTURE  POCT CBC W AUTO DIFF ([facility]):  WBC 10.8; LY 30.9; MO 6.0; GR 63.1; Hgb 14.1; Platelets  320     EKG None  Radiology No results found.  Procedures Procedures (including critical care time)  Medications Ordered in UC Medications - No data to display  Initial Impression / Assessment and Plan / UC Course  I have reviewed the triage vital signs and the nursing notes.  Pertinent labs & imaging results that were available during my care of the patient were reviewed by me and considered in my medical decision making (see chart for details).    Urine culture pending Begin Keflex 500mg  BID. Followup with PCP in two days as scheduled.  Recommend evaluation by urologist.   Final Clinical Impressions(s) / UC Diagnoses   Final diagnoses:  Gross hematuria     Discharge Instructions     Increase fluid intake. If symptoms become significantly worse during the night or over the weekend, proceed to the local emergency room.     ED Prescriptions    Medication Sig Dispense Auth. Provider   cephALEXin (KEFLEX) 500 MG capsule Take 1 capsule (500 mg total) by mouth 2 (two) times daily. 14 capsule Kandra Nicolas, MD         Kandra Nicolas, MD 01/04/18 1336

## 2017-12-28 NOTE — Discharge Instructions (Addendum)
Increase fluid intake. °If symptoms become significantly worse during the night or over the weekend, proceed to the local emergency room.  °

## 2017-12-28 NOTE — ED Triage Notes (Signed)
Pt c/o hematuria she noticed yesterday. States much worse today. Denies dysuria or frequency. States mild pelvic pressure.

## 2017-12-29 LAB — URINE CULTURE
MICRO NUMBER:: 90577955
SPECIMEN QUALITY: ADEQUATE

## 2017-12-30 ENCOUNTER — Telehealth: Payer: Self-pay

## 2017-12-30 NOTE — Telephone Encounter (Signed)
Advised pt of negative ucx results. Pt states she still has some blood in her urine but it does seem to be resolving. Advised pt to f/u with family doc if it doesn't completely resolve soon.

## 2018-07-18 ENCOUNTER — Encounter: Payer: Self-pay | Admitting: *Deleted

## 2018-07-18 ENCOUNTER — Emergency Department
Admission: EM | Admit: 2018-07-18 | Discharge: 2018-07-18 | Disposition: A | Discharge status: Home / Self Care | Payer: Medicare Other | Source: Home / Self Care | Attending: Family Medicine | Admitting: Family Medicine

## 2018-07-18 ENCOUNTER — Emergency Department (INDEPENDENT_AMBULATORY_CARE_PROVIDER_SITE_OTHER): Payer: Medicare Other

## 2018-07-18 ENCOUNTER — Other Ambulatory Visit: Payer: Self-pay

## 2018-07-18 DIAGNOSIS — S92425A Nondisplaced fracture of distal phalanx of left great toe, initial encounter for closed fracture: Secondary | ICD-10-CM

## 2018-07-18 DIAGNOSIS — W19XXXA Unspecified fall, initial encounter: Secondary | ICD-10-CM

## 2018-07-18 MED ORDER — TRAMADOL HCL 50 MG PO TABS: 50.0000 mg | ORAL_TABLET | Freq: Four times a day (QID) | ORAL | 0 refills | Status: DC | PRN

## 2018-07-18 NOTE — ED Triage Notes (Signed)
Pt c/o LT great toe pain x 2 days post fall. No OTC meds today.

## 2018-07-18 NOTE — Discharge Instructions (Signed)
°  Tramadol is strong pain medication. While taking, do not drink alcohol, drive, or perform any other activities that requires focus while taking these medications.   You may take 500mg  acetaminophen every 4-6 hours or in combination with ibuprofen 400-600mg  every 6-8 hours as needed for pain and inflammation.  Please call to schedule an appointment with Sports Medicine in 1-2 weeks for recheck of toe fracture.

## 2018-07-18 NOTE — ED Provider Notes (Signed)
[name]    CSN: [id number] Arrival date & time: 07/18/18  4034     History   Chief Complaint Chief Complaint  Patient presents with  . Toe Injury    HPI Melanie Flores is a 72 y.o. female.   HPI  Melanie Flores is a 72 y.o. female presenting to UC with c/o Left great toe pain that started 2 days ago after she feel while standing on a step cleaning her house.  Pain is aching and throbbing, worse with weight bearing, moderate severity. Associated swelling and bruising. No prior fracture to same toe or foot.     Past Medical History:  Diagnosis Date  . Breast cancer ([place])   . Cancer ([place])    ovarian, breast  . Chronic bronchitis ([place])   . COPD (chronic obstructive pulmonary disease) ([place])   . Hypertension     Patient Active Problem List   Diagnosis Date Noted  . 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 HYPERTENSION 06/24/2008    Past Surgical History:  Procedure Laterality Date  . ABDOMINAL HYSTERECTOMY    . BREAST SURGERY    . CESAREAN SECTION    . CHOLECYSTECTOMY    . SPLENECTOMY, TOTAL      OB History   None      Home Medications    Prior to Admission medications   Medication Sig Start Date End Date Taking? Authorizing Provider  albuterol (PROVENTIL HFA;VENTOLIN HFA) 108 (90 Base) MCG/ACT inhaler Inhale into the lungs every 6 (six) hours as needed for wheezing or shortness of breath.    [provider]  augmented betamethasone dipropionate (DIPROLENE AF) 0.05 % cream Apply topically 2 (two) times daily. 12/05/17   Darlyne Russian, MD  B Complex Vitamins (VITAMIN-B COMPLEX PO) Take by mouth.    [provider]  calcium carbonate 1250 MG capsule Take 1,250 mg by mouth 2 (two) times daily with a meal.    [provider]  Cholecalciferol (VITAMIN D-3 PO) Take by mouth.    [provider]  hydrochlorothiazide (MICROZIDE) 12.5 MG capsule Take 12.5 mg by mouth daily.    [provider]  ketoconazole  (NIZORAL) 2 % cream Apply 1 application topically 2 (two) times daily. 03/24/17   Kandra Nicolas, MD  MELATONIN PO Take by mouth.    [provider]  metoprolol succinate (TOPROL-XL) 25 MG 24 hr tablet Take 25 mg by mouth daily.    [provider]  METOPROLOL TARTRATE PO Take by mouth.    [provider]  MULTIPLE VITAMIN PO Take by mouth.    [provider]  olopatadine (PATANOL) 0.1 % ophthalmic solution Place 1 drop into both eyes 2 (two) times daily. 09/04/17   Noe Gens, PA-C  senna (SENOKOT) 8.6 MG tablet Take 1 tablet by mouth daily.    [provider]  tiotropium (SPIRIVA) 18 MCG inhalation capsule Place 18 mcg into inhaler and inhale daily.    [provider]  traMADol (ULTRAM) 50 MG tablet Take 1 tablet (50 mg total) by mouth every 6 (six) hours as needed. 07/18/18   Noe Gens, PA-C    Family History History reviewed. No pertinent family history.  Social History Social History   Tobacco Use  . Smoking status: Former Smoker    Packs/day: 0.50    Types: Cigarettes  . Smokeless tobacco: Never Used  Substance Use Topics  . Alcohol use: Yes    Comment: 1/2 case per week   .  Drug use: No     Allergies   Chantix [varenicline]; Levaquin [levofloxacin]; and Morphine and related   Review of Systems Review of Systems  Musculoskeletal: Positive for arthralgias, joint swelling and myalgias.  Skin: Positive for color change. Negative for wound.     Physical Exam Triage Vital Signs ED Triage Vitals  Enc Vitals Group     BP 07/18/18 1043 (!) 142/80     Pulse Rate 07/18/18 1043 (!) 55     Resp 07/18/18 1043 18     Temp 07/18/18 1043 97.8 F (36.6 C)     Temp Source 07/18/18 1043 Oral     SpO2 07/18/18 1043 94 %     Weight 07/18/18 1046 158 lb (71.7 kg)     Height 07/18/18 1046 5\' 5"  (1.651 m)     Head Circumference --      Peak Flow --      Pain Score 07/18/18 1045 7     Pain Loc --      Pain Edu? --       Excl. in [place]? --    No data found.  Updated Vital Signs BP (!) 142/80 (BP Location: Right Arm)   Pulse (!) 55   Temp 97.8 F (36.6 C) (Oral)   Resp 18   Ht 5\' 5"  (1.651 m)   Wt 158 lb (71.7 kg)   SpO2 94%   BMI 26.29 kg/m   Visual Acuity Right Eye Distance:   Left Eye Distance:   Bilateral Distance:    Right Eye Near:   Left Eye Near:    Bilateral Near:     Physical Exam  Constitutional: She is oriented to person, place, and time. She appears well-developed and well-nourished.  HENT:  Head: Normocephalic and atraumatic.  Eyes: EOM are normal.  Neck: Normal range of motion.  Cardiovascular: Normal rate.  Pulmonary/Chest: Effort normal.  Musculoskeletal: Normal range of motion. She exhibits edema and tenderness.  Left great toe: mild edema, diffuse tenderness. Full ROM No tenderness to foot.  Neurological: She is alert and oriented to person, place, and time.  Skin: Skin is warm and dry. Capillary refill takes less than 2 seconds.  Left great toe: skin in tact. Diffuse ecchymosis. Small bullae over proximal phalanx   Psychiatric: She has a normal mood and affect. Her behavior is normal.  Nursing note and vitals reviewed.    UC Treatments / Results  Labs (all labs ordered are listed, but only abnormal results are displayed) Labs Reviewed - No data to display  EKG None  Radiology Dg Foot Complete Left  Result Date: 07/18/2018 CLINICAL DATA:  72 year old who fell two days ago and injured the LEFT great toe. Persistent pain, swelling and bruising. Initial encounter. EXAM: LEFT FOOT - COMPLETE 3+ VIEW COMPARISON:  11/09/2015. FINDINGS: Acute nondisplaced fracture involving the MEDIAL base of the distal phalanx of the great toe with intra-articular extension. No other fractures. Complete loss of the first MTP joint space with associated hypertrophic spurring. Remaining joint spaces well-preserved. Bone mineral density well preserved for age. IMPRESSION: 1. Acute  nondisplaced intra-articular fracture involving the MEDIAL base of the distal phalanx of the great toe. 2. 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 Electronically Signed   By: Evangeline Dakin M.D.   On: 07/18/2018 10:57    Procedures Procedures (including critical care time)  Medications Ordered in UC Medications - No data to display  Initial Impression / Assessment and Plan / UC Course  I have  reviewed the triage vital signs and the nursing notes.  Pertinent labs & imaging results that were available during my care of the patient were reviewed by me and considered in my medical decision making (see chart for details).     Imaging reviewed and discussed with pt. Pt placed in walking boot, home care info provided.  Final Clinical Impressions(s) / UC Diagnoses   Final diagnoses:  Closed nondisplaced fracture of distal phalanx of left great toe, initial encounter     Discharge Instructions      Tramadol is strong pain medication. While taking, do not drink alcohol, drive, or perform any other activities that requires focus while taking these medications.   You may take 500mg  acetaminophen every 4-6 hours or in combination with ibuprofen 400-600mg  every 6-8 hours as needed for pain and inflammation.  Please call to schedule an appointment with Sports Medicine in 1-2 weeks for recheck of toe fracture.      ED Prescriptions    Medication Sig Dispense Auth. Provider   traMADol (ULTRAM) 50 MG tablet Take 1 tablet (50 mg total) by mouth every 6 (six) hours as needed. 15 tablet Noe Gens, PA-C     Controlled Substance Prescriptions [place] Controlled Substance Registry consulted? Yes, I have consulted the [place] Controlled Substances Registry for this patient, and feel the risk/benefit ratio today is favorable for proceeding with this prescription for a controlled substance.   Noe Gens, PA-C 07/18/18 1214

## 2018-08-05 ENCOUNTER — Ambulatory Visit (INDEPENDENT_AMBULATORY_CARE_PROVIDER_SITE_OTHER): Payer: Medicare Other | Admitting: Sports Medicine

## 2018-08-05 ENCOUNTER — Encounter: Payer: Self-pay | Admitting: Sports Medicine

## 2018-08-05 DIAGNOSIS — S92402A Displaced unspecified fracture of left great toe, initial encounter for closed fracture: Secondary | ICD-10-CM | POA: Insufficient documentation

## 2018-08-05 DIAGNOSIS — S92425A Nondisplaced fracture of distal phalanx of left great toe, initial encounter for closed fracture: Secondary | ICD-10-CM | POA: Diagnosis not present

## 2018-08-05 NOTE — Assessment & Plan Note (Signed)
Tiny fracture at the medial base of the first distal phalanx. She is completely pain-free, the boot is causing her significant right knee pain, transition into normal footwear, she will try to use rigid shoes. No pain medication needed. If her knee is not feeling better in a couple of weeks we can add an NSAID. Return to see me in 1 month to ensure things are going well.

## 2018-08-05 NOTE — Progress Notes (Signed)
Subjective:    CC: Foot fracture  HPI:  Melanie Flores is a pleasant 72 year old female, 3 weeks ago she stubbed her toe, had immediate pain, swelling, bruising.  She was seen in urgent care where x-rays showed a fracture of the distal first phalanx of her left foot.  She has been in a boot, the leg length discrepancy created by the boot has unfortunately created severe pain in her right knee with swelling.  Overall her toe feels good, no pain.  I reviewed the past medical history, family history, social history, surgical history, and allergies today and no changes were needed.  Please see the problem list section below in epic for further details.  Past Medical History: Past Medical History:  Diagnosis Date  . Breast cancer ([place])   . Cancer ([place])    ovarian, breast  . Chronic bronchitis ([place])   . COPD (chronic obstructive pulmonary disease) ([place])   . Depression   . Hypertension    Past Surgical History: Past Surgical History:  Procedure Laterality Date  . ABDOMINAL HYSTERECTOMY    . BREAST SURGERY    . CESAREAN SECTION    . CHOLECYSTECTOMY    . SPLENECTOMY, TOTAL    . TONSILLECTOMY     Social History: Social History   Socioeconomic History  . Marital status: Married    Spouse name: Not on file  . Number of children: Not on file  . Years of education: Not on file  . Highest education level: Not on file  Occupational History  . Not on file  Social Needs  . Financial resource strain: Not on file  . Food insecurity:    Worry: Not on file    Inability: Not on file  . Transportation needs:    Medical: Not on file    Non-medical: Not on file  Tobacco Use  . Smoking status: Former Smoker    Packs/day: 0.50    Types: Cigarettes  . Smokeless tobacco: Never Used  Substance and Sexual Activity  . Alcohol use: Yes    Comment: 1/2 case per week   . Drug use: No  . Sexual activity: Not Currently  Lifestyle  . Physical activity:    Days per week: Not on file    Minutes per  session: Not on file  . Stress: Not on file  Relationships  . Social connections:    Talks on phone: Not on file    Gets together: Not on file    Attends religious service: Not on file    Active member of club or organization: Not on file    Attends meetings of clubs or organizations: Not on file    Relationship status: Not on file  Other Topics Concern  . Not on file  Social History Narrative  . Not on file   Family History: No family history on file. Allergies: Allergies  Allergen Reactions  . Chantix [Varenicline]   . Levaquin [Levofloxacin]   . Morphine And Related    Medications: See med rec.  Review of Systems: No headache, visual changes, nausea, vomiting, diarrhea, constipation, dizziness, abdominal pain, skin rash, fevers, chills, night sweats, swollen lymph nodes, weight loss, chest pain, body aches, joint swelling, muscle aches, shortness of breath, mood changes, visual or auditory hallucinations.  Objective:    General: Well Developed, well nourished, and in no acute distress.  Neuro: Alert and oriented x3, extra-ocular muscles intact, sensation grossly intact.  HEENT: Normocephalic, atraumatic, pupils equal round reactive to light, neck supple, no  masses, no lymphadenopathy, thyroid nonpalpable.  Skin: Warm and dry, no rashes noted.  Cardiac: Regular rate and rhythm, no murmurs rubs or gallops.  Respiratory: Clear to auscultation bilaterally. Not using accessory muscles, speaking in full sentences.  Abdominal: Soft, nontender, nondistended, positive bowel sounds, no masses, no organomegaly.  Left foot: Visibly swollen great toe, no tenderness even at the fracture site. Range of motion is full in all directions. Strength is 5/5 in all directions. No hallux valgus. No pes cavus or pes planus. No abnormal callus noted. No pain over the navicular prominence, or base of fifth metatarsal. No tenderness to palpation of the calcaneal insertion of plantar fascia. No  pain at the Achilles insertion. No pain over the calcaneal bursa. No pain of the retrocalcaneal bursa. No tenderness to palpation over the tarsals, metatarsals, or phalanges. No hallux rigidus or limitus. No tenderness palpation over interphalangeal joints. No pain with compression of the metatarsal heads. Neurovascularly intact distally. Able to ambulate normally barefoot without pain. Right knee: Visibly swollen, palpable fluid wave with effusion ROM normal in flexion and extension and lower leg rotation. Ligaments with solid consistent endpoints including ACL, PCL, LCL, MCL. Negative Mcmurray's and provocative meniscal tests. Non painful patellar compression. Patellar and quadriceps tendons unremarkable. Hamstring and quadriceps strength is normal.  Impression and Recommendations:    The patient was counselled, risk factors were discussed, anticipatory guidance given.  Fracture of great toe, left, closed Tiny fracture at the medial base of the first distal phalanx. She is completely pain-free, the boot is causing her significant right knee pain, transition into normal footwear, she will try to use rigid shoes. No pain medication needed. If her knee is not feeling better in a couple of weeks we can add an NSAID. Return to see me in 1 month to ensure things are going well. ___________________________________________ Gwen Her. Dianah Field, M.D., ABFM., CAQSM. Primary Care and Sports Medicine [facility]  Adjunct Professor of [employer]

## 2018-09-03 ENCOUNTER — Ambulatory Visit (INDEPENDENT_AMBULATORY_CARE_PROVIDER_SITE_OTHER): Payer: Medicare Other | Admitting: Sports Medicine

## 2018-09-03 ENCOUNTER — Encounter: Payer: Self-pay | Admitting: Sports Medicine

## 2018-09-03 ENCOUNTER — Ambulatory Visit (INDEPENDENT_AMBULATORY_CARE_PROVIDER_SITE_OTHER): Payer: Medicare Other

## 2018-09-03 DIAGNOSIS — M25562 Pain in left knee: Secondary | ICD-10-CM | POA: Diagnosis not present

## 2018-09-03 DIAGNOSIS — M1711 Unilateral primary osteoarthritis, right knee: Secondary | ICD-10-CM | POA: Diagnosis not present

## 2018-09-03 DIAGNOSIS — S92425D Nondisplaced fracture of distal phalanx of left great toe, subsequent encounter for fracture with routine healing: Secondary | ICD-10-CM | POA: Diagnosis not present

## 2018-09-03 DIAGNOSIS — M25561 Pain in right knee: Secondary | ICD-10-CM | POA: Diagnosis not present

## 2018-09-03 NOTE — Progress Notes (Signed)
Subjective:    CC: Knee pain  HPI: This is a pleasant 73 year old female, we have been treating her for a left first distal phalangeal fracture.  She did have some increase in pain while walking in the boot, specifically on her right knee.  Her toe is completely better.  Pain in the right knee is both of the medial and lateral joint line with gelling and minimal swelling, oral analgesics and NSAIDs are not effective, pain is moderate, persistent, localized without radiation.  I reviewed the past medical history, family history, social history, surgical history, and allergies today and no changes were needed.  Please see the problem list section below in epic for further details.  Past Medical History: Past Medical History:  Diagnosis Date  . Breast cancer ([place])   . Cancer ([place])    ovarian, breast  . Chronic bronchitis ([place])   . COPD (chronic obstructive pulmonary disease) ([place])   . Depression   . Hypertension    Past Surgical History: Past Surgical History:  Procedure Laterality Date  . ABDOMINAL HYSTERECTOMY    . BREAST SURGERY    . CESAREAN SECTION    . CHOLECYSTECTOMY    . SPLENECTOMY, TOTAL    . TONSILLECTOMY     Social History: Social History   Socioeconomic History  . Marital status: Married    Spouse name: Not on file  . Number of children: Not on file  . Years of education: Not on file  . Highest education level: Not on file  Occupational History  . Not on file  Social Needs  . Financial resource strain: Not on file  . Food insecurity:    Worry: Not on file    Inability: Not on file  . Transportation needs:    Medical: Not on file    Non-medical: Not on file  Tobacco Use  . Smoking status: Former Smoker    Packs/day: 0.50    Types: Cigarettes  . Smokeless tobacco: Never Used  Substance and Sexual Activity  . Alcohol use: Yes    Comment: 1/2 case per week   . Drug use: No  . Sexual activity: Not Currently  Lifestyle  . Physical activity:    Days  per week: Not on file    Minutes per session: Not on file  . Stress: Not on file  Relationships  . Social connections:    Talks on phone: Not on file    Gets together: Not on file    Attends religious service: Not on file    Active member of club or organization: Not on file    Attends meetings of clubs or organizations: Not on file    Relationship status: Not on file  Other Topics Concern  . Not on file  Social History Narrative  . Not on file   Family History: No family history on file. Allergies: Allergies  Allergen Reactions  . Chantix [Varenicline]   . Levaquin [Levofloxacin]   . Morphine And Related    Medications: See med rec.  Review of Systems: No fevers, chills, night sweats, weight loss, chest pain, or shortness of breath.   Objective:    General: Well Developed, well nourished, and in no acute distress.  Neuro: Alert and oriented x3, extra-ocular muscles intact, sensation grossly intact.  HEENT: Normocephalic, atraumatic, pupils equal round reactive to light, neck supple, no masses, no lymphadenopathy, thyroid nonpalpable.  Skin: Warm and dry, no rashes. Cardiac: Regular rate and rhythm, no murmurs rubs or gallops, no  lower extremity edema.  Respiratory: Clear to auscultation bilaterally. Not using accessory muscles, speaking in full sentences. Left foot: Only minimally swollen left first IP joint, no tenderness over the entirety of the first distal phalanx. Range of motion is full in all directions. Strength is 5/5 in all directions. No hallux valgus. No pes cavus or pes planus. No abnormal callus noted. No pain over the navicular prominence, or base of fifth metatarsal. No tenderness to palpation of the calcaneal insertion of plantar fascia. No pain at the Achilles insertion. No pain over the calcaneal bursa. No pain of the retrocalcaneal bursa. No tenderness to palpation over the tarsals, metatarsals, or phalanges. No hallux rigidus or limitus. No  tenderness palpation over interphalangeal joints. No pain with compression of the metatarsal heads. Neurovascularly intact distally. Right knee: Minimally swollen, mild fluid wave, tender to palpation at the medial and lateral joint lines. ROM normal in flexion and extension and lower leg rotation. Ligaments with solid consistent endpoints including ACL, PCL, LCL, MCL. Negative Mcmurray's and provocative meniscal tests. Non painful patellar compression. Patellar and quadriceps tendons unremarkable. Hamstring and quadriceps strength is normal.  Procedure: Real-time Ultrasound Guided Injection of right knee Device: GE Logiq E  Verbal informed consent obtained.  Time-out conducted.  Noted no overlying erythema, induration, or other signs of local infection.  Skin prepped in a sterile fashion.  Local anesthesia: Topical Ethyl chloride.  With sterile technique and under real time ultrasound guidance: 25-gauge needle advanced into the suprapatellar recess, I then injected 1 cc Kenalog 40, 2 cc lidocaine, 2 cc bupivacaine. Completed without difficulty  Pain immediately resolved suggesting accurate placement of the medication.  Advised to call if fevers/chills, erythema, induration, drainage, or persistent bleeding.  Images permanently stored and available for review in the ultrasound unit.  Impression: Technically successful ultrasound guided injection.  Impression and Recommendations:    Primary osteoarthritis of right knee Significant gelling, did not improve after discontinuation of boot on the left. Injection today. X-rays. Rehab exercises given. Return to see me in a month.  Fracture of great toe, left, closed Resolved. ___________________________________________ Gwen Her. Dianah Field, M.D., ABFM., CAQSM. Primary Care and Sports Medicine [facility]  Adjunct Professor of [place] [employer] [facility] [employer]

## 2018-09-03 NOTE — Assessment & Plan Note (Signed)
Resolved

## 2018-09-03 NOTE — Assessment & Plan Note (Signed)
Significant gelling, did not improve after discontinuation of boot on the left. Injection today. X-rays. Rehab exercises given. Return to see me in a month.

## 2018-09-25 ENCOUNTER — Encounter: Payer: Self-pay | Admitting: Sports Medicine

## 2018-09-25 ENCOUNTER — Ambulatory Visit (INDEPENDENT_AMBULATORY_CARE_PROVIDER_SITE_OTHER): Payer: Medicare Other | Admitting: Sports Medicine

## 2018-09-25 DIAGNOSIS — M1711 Unilateral primary osteoarthritis, right knee: Secondary | ICD-10-CM | POA: Diagnosis not present

## 2018-09-25 MED ORDER — IBUPROFEN 800 MG PO TABS: 800.0000 mg | ORAL_TABLET | Freq: Three times a day (TID) | ORAL | 2 refills | Status: DC | PRN

## 2018-09-25 NOTE — Progress Notes (Signed)
Subjective:    CC: Recheck knee pain  HPI: Melanie Flores is a pleasant 73 year old female, she is had some right knee pain, anterolateral, we injected her at the last visit.  She had only a few days of relief but now with recurrence of pain, as well as a catching and locking with twisting in the anterior lateral joint line.  Moderate, persistent, localized without radiation.  I reviewed the past medical history, family history, social history, surgical history, and allergies today and no changes were needed.  Please see the problem list section below in epic for further details.  Past Medical History: Past Medical History:  Diagnosis Date  . Breast cancer ([place])   . Cancer ([place])    ovarian, breast  . Chronic bronchitis ([place])   . COPD (chronic obstructive pulmonary disease) ([place])   . Depression   . Hypertension    Past Surgical History: Past Surgical History:  Procedure Laterality Date  . ABDOMINAL HYSTERECTOMY    . BREAST SURGERY    . CESAREAN SECTION    . CHOLECYSTECTOMY    . SPLENECTOMY, TOTAL    . TONSILLECTOMY     Social History: Social History   Socioeconomic History  . Marital status: Married    Spouse name: Not on file  . Number of children: Not on file  . Years of education: Not on file  . Highest education level: Not on file  Occupational History  . Not on file  Social Needs  . Financial resource strain: Not on file  . Food insecurity:    Worry: Not on file    Inability: Not on file  . Transportation needs:    Medical: Not on file    Non-medical: Not on file  Tobacco Use  . Smoking status: Former Smoker    Packs/day: 0.50    Types: Cigarettes  . Smokeless tobacco: Never Used  Substance and Sexual Activity  . Alcohol use: Yes    Comment: 1/2 case per week   . Drug use: No  . Sexual activity: Not Currently  Lifestyle  . Physical activity:    Days per week: Not on file    Minutes per session: Not on file  . Stress: Not on file  Relationships  . Social  connections:    Talks on phone: Not on file    Gets together: Not on file    Attends religious service: Not on file    Active member of club or organization: Not on file    Attends meetings of clubs or organizations: Not on file    Relationship status: Not on file  Other Topics Concern  . Not on file  Social History Narrative  . Not on file   Family History: No family history on file. Allergies: Allergies  Allergen Reactions  . Chantix [Varenicline]   . Levaquin [Levofloxacin]   . Morphine And Related    Medications: See med rec.  Review of Systems: No fevers, chills, night sweats, weight loss, chest pain, or shortness of breath.   Objective:    General: Well Developed, well nourished, and in no acute distress.  Neuro: Alert and oriented x3, extra-ocular muscles intact, sensation grossly intact.  HEENT: Normocephalic, atraumatic, pupils equal round reactive to light, neck supple, no masses, no lymphadenopathy, thyroid nonpalpable.  Skin: Warm and dry, no rashes. Cardiac: Regular rate and rhythm, no murmurs rubs or gallops, no lower extremity edema.  Respiratory: Clear to auscultation bilaterally. Not using accessory muscles, speaking in full sentences. Right  knee: Moderate effusion, tender to palpation in the anterior lateral joint line ROM normal in flexion and extension and lower leg rotation. Ligaments with solid consistent endpoints including ACL, PCL, LCL, MCL. Negative Mcmurray's and provocative meniscal tests. Non painful patellar compression. Patellar and quadriceps tendons unremarkable. Hamstring and quadriceps strength is normal.  Impression and Recommendations:    Primary osteoarthritis of right knee Persistent pain and swelling as well as mechanical symptoms in spite of injection. MRI, prescription strength ibuprofen. Return to go over MRI results. ___________________________________________ Gwen Her. Dianah Field, M.D., ABFM., CAQSM. Primary Care and  Sports Medicine [facility]  Adjunct Professor of [place] [employer] [facility] [employer] Medicine

## 2018-09-25 NOTE — Assessment & Plan Note (Signed)
Persistent pain and swelling as well as mechanical symptoms in spite of injection. MRI, prescription strength ibuprofen. Return to go over MRI results.

## 2018-09-29 ENCOUNTER — Ambulatory Visit (INDEPENDENT_AMBULATORY_CARE_PROVIDER_SITE_OTHER): Payer: Medicare Other

## 2018-09-29 DIAGNOSIS — M7121 Synovial cyst of popliteal space [Baker], right knee: Secondary | ICD-10-CM | POA: Diagnosis not present

## 2018-09-29 DIAGNOSIS — M1711 Unilateral primary osteoarthritis, right knee: Secondary | ICD-10-CM | POA: Diagnosis not present

## 2018-10-01 ENCOUNTER — Telehealth: Payer: Self-pay | Admitting: *Deleted

## 2018-10-01 ENCOUNTER — Ambulatory Visit: Payer: Medicare Other | Admitting: Sports Medicine

## 2018-10-01 DIAGNOSIS — M1711 Unilateral primary osteoarthritis, right knee: Secondary | ICD-10-CM

## 2018-10-01 NOTE — Telephone Encounter (Signed)
Done

## 2018-10-01 NOTE — Telephone Encounter (Signed)
Pt called back today regarding her knee MRI and is ok with you placing a referral to the surgeon for a knee replacement.

## 2018-12-19 ENCOUNTER — Other Ambulatory Visit: Payer: Self-pay | Admitting: *Deleted

## 2018-12-23 ENCOUNTER — Telehealth (INDEPENDENT_AMBULATORY_CARE_PROVIDER_SITE_OTHER): Payer: Medicare Other | Admitting: *Deleted

## 2018-12-23 DIAGNOSIS — M1711 Unilateral primary osteoarthritis, right knee: Secondary | ICD-10-CM | POA: Diagnosis not present

## 2018-12-23 MED ORDER — TRAMADOL HCL 50 MG PO TABS: 50.0000 mg | ORAL_TABLET | Freq: Three times a day (TID) | ORAL | 0 refills | Status: DC | PRN

## 2018-12-23 NOTE — Telephone Encounter (Signed)
Done.  I spent 5 total minutes of online digital evaluation and management services.

## 2018-12-23 NOTE — Telephone Encounter (Signed)
Pt notified of Tramdol refill.

## 2018-12-23 NOTE — Telephone Encounter (Signed)
Pt called this morning wanting to know if you would give her another small refill of Tramadol.  She is taking the Ibuprofen 800mg  but sometimes still has some significant pain.  She spoke to Dr. Berenice Primas office last week and was told that she'd be one of the first pts scheduled for surgery when they start scheduling at the end of this month. Please advise regarding Tramadol.

## 2019-01-06 ENCOUNTER — Other Ambulatory Visit: Payer: Self-pay | Admitting: Sports Medicine

## 2019-01-06 DIAGNOSIS — M1711 Unilateral primary osteoarthritis, right knee: Secondary | ICD-10-CM

## 2019-01-06 MED ORDER — IBUPROFEN 800 MG PO TABS: 800.0000 mg | ORAL_TABLET | Freq: Three times a day (TID) | ORAL | 2 refills | Status: DC | PRN

## 2019-04-02 ENCOUNTER — Other Ambulatory Visit: Payer: Self-pay | Admitting: Sports Medicine

## 2019-04-02 DIAGNOSIS — M1711 Unilateral primary osteoarthritis, right knee: Secondary | ICD-10-CM

## 2019-08-11 ENCOUNTER — Emergency Department
Admission: EM | Admit: 2019-08-11 | Discharge: 2019-08-11 | Disposition: A | Discharge status: Home / Self Care | Payer: Medicare Other | Source: Home / Self Care

## 2019-08-11 ENCOUNTER — Other Ambulatory Visit: Payer: Self-pay

## 2019-08-11 ENCOUNTER — Emergency Department (INDEPENDENT_AMBULATORY_CARE_PROVIDER_SITE_OTHER): Payer: Medicare Other

## 2019-08-11 DIAGNOSIS — J069 Acute upper respiratory infection, unspecified: Secondary | ICD-10-CM | POA: Diagnosis not present

## 2019-08-11 DIAGNOSIS — R05 Cough: Secondary | ICD-10-CM | POA: Diagnosis not present

## 2019-08-11 DIAGNOSIS — J449 Chronic obstructive pulmonary disease, unspecified: Secondary | ICD-10-CM

## 2019-08-11 DIAGNOSIS — B9789 Other viral agents as the cause of diseases classified elsewhere: Secondary | ICD-10-CM

## 2019-08-11 DIAGNOSIS — R059 Cough, unspecified: Secondary | ICD-10-CM

## 2019-08-11 LAB — POC SARS CORONAVIRUS 2 AG -  ED: SARS Coronavirus 2 Ag: NEGATIVE

## 2019-08-11 MED ORDER — PREDNISONE 10 MG PO TABS: 40.0000 mg | ORAL_TABLET | Freq: Every day | ORAL | 0 refills | Status: AC

## 2019-08-11 NOTE — ED Triage Notes (Signed)
Pt has a hx of COPD.  Since Friday has had some SOB, cough, fatigue, nasal congestion.  Has had a sinus headache for a few days.

## 2019-08-11 NOTE — Discharge Instructions (Addendum)
You should quarantine until receiving the results of your Covid swab.  Please return for any worsening symptoms/shortness of breath.

## 2019-08-11 NOTE — ED Provider Notes (Signed)
[name]    CSN: [id number] Arrival date & time: 08/11/19  0841      History   Chief Complaint Chief Complaint  Patient presents with  . Headache  . Nasal Congestion  . Fatigue    HPI Melanie Flores is a 73 y.o. female.   73 year old female, with history of hypertension, COPD, depression, remote ovarian and breast cancer, presenting today due to cough and congestion.  Patient states that since Friday, she has had nasal congestion, postnasal drip, cough, shortness of breath and fatigue.  States that she has had a decrease in her sense of smell but is still able to taste.  Has had subjective fever and chills.  Denies any known sick contacts or recent Covid exposure.  The history is provided by the patient.  URI Presenting symptoms: congestion, cough, fatigue, fever and rhinorrhea   Presenting symptoms: no ear pain and no sore throat   Severity:  Moderate Onset quality:  Gradual Duration:  5 days Timing:  Constant Progression:  Worsening Chronicity:  New Relieved by:  Nothing Worsened by:  Nothing Ineffective treatments:  None tried Associated symptoms: headaches, myalgias and wheezing   Associated symptoms: no arthralgias, no neck pain, no sinus pain, no sneezing and no swollen glands   Risk factors: being elderly and chronic respiratory disease   Risk factors: no chronic cardiac disease, no chronic kidney disease, no diabetes mellitus, no immunosuppression, no recent illness, no recent travel and no sick contacts     Past Medical History:  Diagnosis Date  . Breast cancer ([place])   . Cancer ([place])    ovarian, breast  . Chronic bronchitis ([place])   . COPD (chronic obstructive pulmonary disease) ([place])   . Depression   . Hypertension     Patient Active Problem List   Diagnosis Date Noted  . Primary osteoarthritis of right knee 09/03/2018  . Fracture of great toe, left, closed 08/05/2018  . 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 HYPERTENSION 06/24/2008     Past Surgical History:  Procedure Laterality Date  . ABDOMINAL HYSTERECTOMY    . BREAST SURGERY    . CESAREAN SECTION    . CHOLECYSTECTOMY    . KNEE SURGERY    . SPLENECTOMY, TOTAL    . TONSILLECTOMY      OB History   No obstetric history on file.      Home Medications    Prior to Admission medications   Medication Sig Start Date End Date Taking? Authorizing Provider  albuterol (PROVENTIL HFA;VENTOLIN HFA) 108 (90 Base) MCG/ACT inhaler Inhale into the lungs every 6 (six) hours as needed for wheezing or shortness of breath.    [provider]  augmented betamethasone dipropionate (DIPROLENE AF) 0.05 % cream Apply topically 2 (two) times daily. 12/05/17   Darlyne Russian, MD  B Complex Vitamins (VITAMIN-B COMPLEX PO) Take by mouth.    [provider]  calcium carbonate 1250 MG capsule Take 1,250 mg by mouth 2 (two) times daily with a meal.    [provider]  Cholecalciferol (VITAMIN D-3 PO) Take by mouth.    [provider]  hydrochlorothiazide (MICROZIDE) 12.5 MG capsule Take 12.5 mg by mouth daily.    [provider]  ibuprofen (ADVIL) 800 MG tablet TAKE 1 TABLET BY MOUTH EVERY 8 HOURS AS NEEDED 04/02/19   Silverio Decamp, MD  MELATONIN PO Take by mouth.    [provider]  METOPROLOL TARTRATE PO Take 25 mg by mouth daily.  [provider]  MULTIPLE VITAMIN PO Take by mouth.    [provider]  olopatadine (PATANOL) 0.1 % ophthalmic solution Place 1 drop into both eyes 2 (two) times daily. 09/04/17   Noe Gens, PA-C  predniSONE (DELTASONE) 10 MG tablet Take 4 tablets (40 mg total) by mouth daily for 5 days. 08/11/19 08/16/19  Ollen Rao C, PA-C  senna (SENOKOT) 8.6 MG tablet Take 1 tablet by mouth daily.    [provider]  STIOLTO RESPIMAT 2.5-2.5 MCG/ACT AERS INHALE TWO PUFFS INTO THE LUNGS DAILY. 06/02/18   [provider]  traMADol (ULTRAM) 50 MG tablet Take 1 tablet  (50 mg total) by mouth every 8 (eight) hours as needed for moderate pain. Maximum 6 tabs per day. 12/23/18   Silverio Decamp, MD    Family History History reviewed. No pertinent family history.  Social History Social History   Tobacco Use  . Smoking status: Former Smoker    Packs/day: 0.50    Types: Cigarettes  . Smokeless tobacco: Never Used  Substance Use Topics  . Alcohol use: Yes    Comment: 1/2 case per week   . Drug use: No     Allergies   Chantix [varenicline], Levaquin [levofloxacin], and Morphine and related   Review of Systems Review of Systems  Constitutional: Positive for fatigue and fever. Negative for chills.  HENT: Positive for congestion and rhinorrhea. Negative for ear pain, sinus pain, sneezing and sore throat.   Eyes: Negative for pain and visual disturbance.  Respiratory: Positive for cough and wheezing. Negative for shortness of breath.   Cardiovascular: Negative for chest pain and palpitations.  Gastrointestinal: Negative for abdominal pain and vomiting.  Genitourinary: Negative for dysuria and hematuria.  Musculoskeletal: Positive for myalgias. Negative for arthralgias, back pain and neck pain.  Skin: Negative for color change and rash.  Neurological: Positive for headaches. Negative for seizures and syncope.  All other systems reviewed and are negative.    Physical Exam Triage Vital Signs ED Triage Vitals  Enc Vitals Group     BP 08/11/19 0906 (!) 161/94     Pulse Rate 08/11/19 0906 63     Resp 08/11/19 0906 20     Temp 08/11/19 0906 98.4 F (36.9 C)     Temp Source 08/11/19 0906 Oral     SpO2 08/11/19 0906 95 %     Weight 08/11/19 0854 168 lb (76.2 kg)     Height 08/11/19 0854 5\' 5"  (1.651 m)     Head Circumference --      Peak Flow --      Pain Score 08/11/19 0854 5     Pain Loc --      Pain Edu? --      Excl. in [place]? --    No data found.  Updated Vital Signs BP (!) 161/94 (BP Location: Left Arm)   Pulse 63   Temp 98.4 F  (36.9 C) (Oral)   Resp 20   Ht 5\' 5"  (1.651 m)   Wt 168 lb (76.2 kg)   SpO2 95%   BMI 27.96 kg/m   Visual Acuity Right Eye Distance:   Left Eye Distance:   Bilateral Distance:    Right Eye Near:   Left Eye Near:    Bilateral Near:     Physical Exam Vitals and nursing note reviewed.  Constitutional:      General: She is not in acute distress.    Appearance: She is well-developed. She  is not diaphoretic.  HENT:     Head: Normocephalic.  Eyes:     Pupils: Pupils are equal, round, and reactive to light.  Cardiovascular:     Rate and Rhythm: Normal rate.  Pulmonary:     Effort: Pulmonary effort is normal.     Breath sounds: Normal breath sounds.  Abdominal:     Palpations: Abdomen is soft.  Musculoskeletal:        General: Normal range of motion.     Cervical back: Normal range of motion.  Skin:    General: Skin is warm.  Neurological:     Mental Status: She is alert and oriented to person, place, and time.      UC Treatments / Results  Labs (all labs ordered are listed, but only abnormal results are displayed) Labs Reviewed  NOVEL CORONAVIRUS, NAA  POC SARS CORONAVIRUS 2 AG -  ED    EKG   Radiology DG Chest 2 View  Result Date: 08/11/2019 CLINICAL DATA:  COPD with cough and shortness of breath EXAM: CHEST - 2 VIEW COMPARISON:  None. FINDINGS: Normal heart size and mediastinal contours. There is no edema, consolidation, effusion, or pneumothorax. Minor scarring at the left base. Surgical clips over the right breast. Thoracic disc degeneration. No acute osseous finding. IMPRESSION: No evidence of active disease. Electronically Signed   By: Monte Fantasia M.D.   On: 08/11/2019 09:43    Procedures Procedures (including critical care time)  Medications Ordered in UC Medications - No data to display  Initial Impression / Assessment and Plan / UC Course  I have reviewed the triage vital signs and the nursing notes.  Pertinent labs & imaging results that  were available during my care of the patient were reviewed by me and considered in my medical decision making (see chart for details).     Patient here today with cough, congestion, myalgias and decreased sense of smell.  Patient does have a history of COPD.  Patient 95% on room air.  Rapid Covid pending.  Chest x-ray ordered.  Chest x-ray unremarkable with no active disease.  Will treat with a round of prednisone as patient has COPD has had worsening shortness of breath and some wheezing.  Rapid Covid was negative.  Send out test added on.  Patient will continue to receive the results.  Strict return precautions discussed. Final Clinical Impressions(s) / UC Diagnoses   Final diagnoses:  Cough  Viral URI with cough     Discharge Instructions     You should quarantine until receiving the results of your Covid swab.  Please return for any worsening symptoms/shortness of breath.    ED Prescriptions    Medication Sig Dispense Auth. Provider   predniSONE (DELTASONE) 10 MG tablet Take 4 tablets (40 mg total) by mouth daily for 5 days. 20 tablet Shirl Ludington C, PA-C     PDMP not reviewed this encounter.   Phebe Colla, [place] 08/11/19 [phone]

## 2019-08-13 LAB — NOVEL CORONAVIRUS, NAA: SARS-CoV-2, NAA: NOT DETECTED

## 2019-10-19 ENCOUNTER — Other Ambulatory Visit: Payer: Self-pay | Admitting: Orthopedic Surgery

## 2019-10-23 NOTE — Patient Instructions (Addendum)
DUE TO COVID-19 ONLY ONE VISITOR IS ALLOWED TO COME WITH YOU AND STAY IN THE WAITING ROOM ONLY DURING PRE OP AND PROCEDURE DAY OF SURGERY. THE 1 VISITOR MAY VISIT WITH YOU AFTER SURGERY IN YOUR PRIVATE ROOM DURING VISITING HOURS ONLY!  YOU NEED TO HAVE A COVID 19 TEST ON: 10/27/19 @ 10:30 am       , THIS TEST MUST BE DONE BEFORE SURGERY, COME  [address] [place] , [address].  ([place]) ONCE YOUR COVID TEST IS COMPLETED, PLEASE BEGIN THE QUARANTINE INSTRUCTIONS AS OUTLINED IN YOUR HANDOUT.                Melanie Flores   Your procedure is scheduled on: 10/30/19   Report to [facility] Main  Entrance   Report to admitting at: 6:30 am AM     Call this number if you have problems the morning of surgery [phone]    Remember:   [place].     Take these medicines the morning of surgery with A SIP OF WATER: bupropion,metoprolol,myrbetriq. Use inhalers and eye drops as needed.                                 You may not have any metal on your body including hair pins and              piercings  Do not wear jewelry, make-up, lotions, powders or perfumes, deodorant             Do not wear nail polish on your fingernails.  Do not shave  48 hours prior to surgery.                Do not bring valuables to the hospital. [place].  Contacts, dentures or bridgework may not be worn into surgery.  Leave suitcase in the car. After surgery it may be brought to your room.     Patients discharged the day of surgery will not be allowed to drive home. IF YOU ARE HAVING SURGERY AND GOING HOME THE SAME DAY, YOU MUST HAVE AN ADULT TO DRIVE YOU HOME AND BE WITH YOU FOR 24 HOURS. YOU MAY GO HOME BY TAXI OR UBER OR ORTHERWISE, BUT AN ADULT MUST ACCOMPANY YOU HOME AND STAY WITH YOU FOR 24 HOURS.  Name and phone number of your  driver:  Special Instructions: N/A              Please read over the following fact sheets you were given: _____________________________________________________________________             NO SOLID FOOD AFTER MIDNIGHT THE NIGHT PRIOR TO SURGERY. NOTHING BY MOUTH EXCEPT CLEAR LIQUIDS UNTIL: 6:00 am . PLEASE FINISH ENSURE DRINK PER SURGEON ORDER  WHICH NEEDS TO BE COMPLETED AT: 6:00 am .   CLEAR LIQUID DIET   Foods Allowed  Foods Excluded  Coffee and tea, regular and decaf                             liquids that you cannot  Plain Jell-O any favor except red or purple                                           see through such as: Fruit ices (not with fruit pulp)                                     milk, soups, orange juice  Iced Popsicles                                    All solid food Carbonated beverages, regular and diet                                    Cranberry, grape and apple juices Sports drinks like Gatorade Lightly seasoned clear broth or consume(fat free) Sugar, honey syrup  Sample Menu Breakfast                                Lunch                                     Supper Cranberry juice                    Beef broth                            Chicken broth Jell-O                                     Grape juice                           Apple juice Coffee or tea                        Jell-O                                      Popsicle                                                Coffee or tea                        Coffee or tea  _____________________________________________________________________  PLEASE BRING CPAP MASK AND  TUBING ONLY. DEVICE WILL BE PROVIDED!    [facility] - Preparing for Surgery Before  surgery, you can play an important role.  Because skin is not sterile, your skin needs to be as free of germs as possible.  You can reduce the number of germs on your skin by washing  with CHG (chlorahexidine gluconate) soap before surgery.  CHG is an antiseptic cleaner which kills germs and bonds with the skin to continue killing germs even after washing. Please DO NOT use if you have an allergy to CHG or antibacterial soaps.  If your skin becomes reddened/irritated stop using the CHG and inform your nurse when you arrive at Short Stay. Do not shave (including legs and underarms) for at least 48 hours prior to the first CHG shower.  You may shave your face/neck. Please follow these instructions carefully:  1.  Shower with CHG Soap the night before surgery and the  morning of Surgery.  2.  If you choose to wash your hair, wash your hair first as usual with your  normal  shampoo.  3.  After you shampoo, rinse your hair and body thoroughly to remove the  shampoo.                           4.  Use CHG as you would any other liquid soap.  You can apply chg directly  to the skin and wash                       Gently with a scrungie or clean washcloth.  5.  Apply the CHG Soap to your body ONLY FROM THE NECK DOWN.   Do not use on face/ open                           Wound or open sores. Avoid contact with eyes, ears mouth and genitals (private parts).                       Wash face,  Genitals (private parts) with your normal soap.             6.  Wash thoroughly, paying special attention to the area where your surgery  will be performed.  7.  Thoroughly rinse your body with warm water from the neck down.  8.  DO NOT shower/wash with your normal soap after using and rinsing off  the CHG Soap.                9.  Pat yourself dry with a clean towel.            10.  Wear clean pajamas.            11.  Place clean sheets on your bed the night of your first shower and do not  sleep with pets. Day of Surgery : Do not apply any lotions/deodorants the morning of surgery.  Please wear clean clothes to the hospital/surgery center.  FAILURE TO FOLLOW THESE INSTRUCTIONS MAY RESULT IN THE  CANCELLATION OF YOUR SURGERY PATIENT SIGNATURE_________________________________  NURSE SIGNATURE__________________________________  ________________________________________________________________________   Melanie Flores  An incentive spirometer is a tool that can help keep your lungs clear and active. This tool measures how well you are filling your lungs with each breath. Taking long deep breaths may help reverse or decrease the chance of developing breathing (pulmonary) problems (especially infection) following:  A long period of time when you are unable to move  or be active. BEFORE THE PROCEDURE   If the spirometer includes an indicator to show your best effort, your nurse or respiratory therapist will set it to a desired goal.  If possible, sit up straight or lean slightly forward. Try not to slouch.  Hold the incentive spirometer in an upright position. INSTRUCTIONS FOR USE  1. Sit on the edge of your bed if possible, or sit up as far as you can in bed or on a chair. 2. Hold the incentive spirometer in an upright position. 3. Breathe out normally. 4. Place the mouthpiece in your mouth and seal your lips tightly around it. 5. Breathe in slowly and as deeply as possible, raising the piston or the ball toward the top of the column. 6. Hold your breath for 3-5 seconds or for as long as possible. Allow the piston or ball to fall to the bottom of the column. 7. Remove the mouthpiece from your mouth and breathe out normally. 8. Rest for a few seconds and repeat Steps 1 through 7 at least 10 times every 1-2 hours when you are awake. Take your time and take a few normal breaths between deep breaths. 9. The spirometer may include an indicator to show your best effort. Use the indicator as a goal to work toward during each repetition. 10. After each set of 10 deep breaths, practice coughing to be sure your lungs are clear. If you have an incision (the cut made at the time of surgery),  support your incision when coughing by placing a pillow or rolled up towels firmly against it. Once you are able to get out of bed, walk around indoors and cough well. You may stop using the incentive spirometer when instructed by your caregiver.  RISKS AND COMPLICATIONS  Take your time so you do not get dizzy or light-headed.  If you are in pain, you may need to take or ask for pain medication before doing incentive spirometry. It is harder to take a deep breath if you are having pain. AFTER USE  Rest and breathe slowly and easily.  It can be helpful to keep track of a log of your progress. Your caregiver can provide you with a simple table to help with this. If you are using the spirometer at home, follow these instructions: [place] IF:   You are having difficultly using the spirometer.  You have trouble using the spirometer as often as instructed.  Your pain medication is not giving enough relief while using the spirometer.  You develop fever of 100.5 F (38.1 C) or higher. SEEK IMMEDIATE MEDICAL CARE IF:   You cough up bloody sputum that had not been present before.  You develop fever of 102 F (38.9 C) or greater.  You develop worsening pain at or near the incision site. MAKE SURE YOU:   Understand these instructions.  Will watch your condition.  Will get help right away if you are not doing well or get worse. Document Released: 12/17/2006 Document Revised: 10/29/2011 Document Reviewed: 02/17/2007 [facility] Patient Information 2014 [place], [place].   ________________________________________________________________________

## 2019-10-26 ENCOUNTER — Encounter (HOSPITAL_COMMUNITY)
Admission: RE | Admit: 2019-10-26 | Discharge: 2019-10-26 | Disposition: A | Discharge status: Home / Self Care | Payer: Medicare PPO | Source: Ambulatory Visit | Attending: Orthopedic Surgery | Admitting: Orthopedic Surgery

## 2019-10-26 ENCOUNTER — Ambulatory Visit (HOSPITAL_COMMUNITY)
Admission: RE | Admit: 2019-10-26 | Discharge: 2019-10-26 | Disposition: A | Discharge status: Home / Self Care | Payer: Medicare PPO | Source: Ambulatory Visit | Attending: Orthopedic Surgery | Admitting: Orthopedic Surgery

## 2019-10-26 ENCOUNTER — Encounter (HOSPITAL_COMMUNITY): Payer: Self-pay

## 2019-10-26 ENCOUNTER — Other Ambulatory Visit: Payer: Self-pay

## 2019-10-26 DIAGNOSIS — Z853 Personal history of malignant neoplasm of breast: Secondary | ICD-10-CM | POA: Diagnosis not present

## 2019-10-26 DIAGNOSIS — Z87891 Personal history of nicotine dependence: Secondary | ICD-10-CM | POA: Diagnosis not present

## 2019-10-26 DIAGNOSIS — J449 Chronic obstructive pulmonary disease, unspecified: Secondary | ICD-10-CM | POA: Insufficient documentation

## 2019-10-26 DIAGNOSIS — Z79899 Other long term (current) drug therapy: Secondary | ICD-10-CM | POA: Insufficient documentation

## 2019-10-26 DIAGNOSIS — I10 Essential (primary) hypertension: Secondary | ICD-10-CM | POA: Insufficient documentation

## 2019-10-26 DIAGNOSIS — Z01811 Encounter for preprocedural respiratory examination: Secondary | ICD-10-CM

## 2019-10-26 DIAGNOSIS — Z01818 Encounter for other preprocedural examination: Secondary | ICD-10-CM | POA: Diagnosis not present

## 2019-10-26 DIAGNOSIS — M1711 Unilateral primary osteoarthritis, right knee: Secondary | ICD-10-CM | POA: Insufficient documentation

## 2019-10-26 HISTORY — DX: Unspecified osteoarthritis, unspecified site: M19.90

## 2019-10-26 HISTORY — DX: Dyspnea, unspecified: R06.00

## 2019-10-26 LAB — URINALYSIS, ROUTINE W REFLEX MICROSCOPIC
Bilirubin Urine: NEGATIVE
Glucose, UA: NEGATIVE mg/dL
Hgb urine dipstick: NEGATIVE
Ketones, ur: NEGATIVE mg/dL
Leukocytes,Ua: NEGATIVE
Nitrite: NEGATIVE
Protein, ur: NEGATIVE mg/dL
Specific Gravity, Urine: 1.006 (ref 1.005–1.030)
pH: 7 (ref 5.0–8.0)

## 2019-10-26 LAB — CBC WITH DIFFERENTIAL/PLATELET
Abs Immature Granulocytes: 0.02 10*3/uL (ref 0.00–0.07)
Basophils Absolute: 0.1 10*3/uL (ref 0.0–0.1)
Basophils Relative: 1 %
Eosinophils Absolute: 0.2 10*3/uL (ref 0.0–0.5)
Eosinophils Relative: 3 %
HCT: 42 % (ref 36.0–46.0)
Hemoglobin: 14.1 g/dL (ref 12.0–15.0)
Immature Granulocytes: 0 %
Lymphocytes Relative: 25 %
Lymphs Abs: 1.8 10*3/uL (ref 0.7–4.0)
MCH: 31.3 pg (ref 26.0–34.0)
MCHC: 33.6 g/dL (ref 30.0–36.0)
MCV: 93.3 fL (ref 80.0–100.0)
Monocytes Absolute: 0.8 10*3/uL (ref 0.1–1.0)
Monocytes Relative: 12 %
Neutro Abs: 4.1 10*3/uL (ref 1.7–7.7)
Neutrophils Relative %: 59 %
Platelets: 360 10*3/uL (ref 150–400)
RBC: 4.5 MIL/uL (ref 3.87–5.11)
RDW: 14.3 % (ref 11.5–15.5)
WBC: 6.9 10*3/uL (ref 4.0–10.5)
nRBC: 0 % (ref 0.0–0.2)

## 2019-10-26 LAB — COMPREHENSIVE METABOLIC PANEL
ALT: 23 U/L (ref 0–44)
AST: 25 U/L (ref 15–41)
Albumin: 4.2 g/dL (ref 3.5–5.0)
Alkaline Phosphatase: 71 U/L (ref 38–126)
Anion gap: 8 (ref 5–15)
BUN: 17 mg/dL (ref 8–23)
CO2: 30 mmol/L (ref 22–32)
Calcium: 10.1 mg/dL (ref 8.9–10.3)
Chloride: 98 mmol/L (ref 98–111)
Creatinine, Ser: 0.8 mg/dL (ref 0.44–1.00)
GFR calc Af Amer: >60 mL/min (ref >60)
GFR calc non Af Amer: >60 mL/min (ref >60)
Glucose, Bld: 105 mg/dL — ABNORMAL HIGH (ref 70–99)
Potassium: 3.8 mmol/L (ref 3.5–5.1)
Sodium: 136 mmol/L (ref 135–145)
Total Bilirubin: 0.5 mg/dL (ref 0.3–1.2)
Total Protein: 7.1 g/dL (ref 6.5–8.1)

## 2019-10-26 LAB — SURGICAL PCR SCREEN
MRSA, PCR: NEGATIVE
Staphylococcus aureus: NEGATIVE

## 2019-10-26 LAB — PROTIME-INR
INR: 0.9 (ref 0.8–1.2)
Prothrombin Time: 12.2 seconds (ref 11.4–15.2)

## 2019-10-26 LAB — APTT: aPTT: 27 seconds (ref 24–36)

## 2019-10-26 LAB — ABO/RH: ABO/RH(D): O POS

## 2019-10-26 NOTE — Progress Notes (Signed)
PCP - Dr. Olin Hauser Cardiologist -   Chest x-ray -  EKG -  Stress Test -  ECHO -  Cardiac Cath -   Sleep Study - yes CPAP - yes  Fasting Blood Sugar -  Checks Blood Sugar _____ times a day  Blood Thinner Instructions: RN instructed pt. To call Dr. Berenice Primas' office for instructions about Aspirin 325 mg. Aspirin Instructions: Last Dose:  Anesthesia review:   Patient denies shortness of breath, fever, cough and chest pain at PAT appointment   Patient verbalized understanding of instructions that were given to them at the PAT appointment. Patient was also instructed that they will need to review over the PAT instructions again at home before surgery.

## 2019-10-27 ENCOUNTER — Other Ambulatory Visit (HOSPITAL_COMMUNITY)
Admission: RE | Admit: 2019-10-27 | Discharge: 2019-10-27 | Disposition: A | Discharge status: Home / Self Care | Payer: Medicare PPO | Source: Ambulatory Visit | Attending: Orthopedic Surgery | Admitting: Orthopedic Surgery

## 2019-10-27 DIAGNOSIS — Z20822 Contact with and (suspected) exposure to covid-19: Secondary | ICD-10-CM | POA: Insufficient documentation

## 2019-10-27 DIAGNOSIS — Z01812 Encounter for preprocedural laboratory examination: Secondary | ICD-10-CM | POA: Insufficient documentation

## 2019-10-28 LAB — SARS CORONAVIRUS 2 (TAT 6-24 HRS): SARS Coronavirus 2: NEGATIVE

## 2019-10-29 MED ORDER — BUPIVACAINE LIPOSOME 1.3 % IJ SUSP
20.0000 mL | Freq: Once | INTRAMUSCULAR | Status: DC
  Filled 2019-10-29: qty 20

## 2019-10-29 NOTE — Progress Notes (Signed)
Pt. Was notified about the time change.Pt. will come tomorrow to admitting at 6:00 am.Also she will drink the ensure at 5:30 am.Pt. verbalized understanding of these changes.

## 2019-10-30 ENCOUNTER — Encounter (HOSPITAL_COMMUNITY)
Admission: RE | Disposition: A | Discharge status: Home / Self Care | Payer: Self-pay | Source: Home / Self Care | Attending: Orthopedic Surgery

## 2019-10-30 ENCOUNTER — Observation Stay (HOSPITAL_COMMUNITY)
Admission: RE | Admit: 2019-10-30 | Discharge: 2019-10-31 | Disposition: A | Discharge status: Home / Self Care | Payer: Medicare PPO | Attending: Orthopedic Surgery | Admitting: Orthopedic Surgery

## 2019-10-30 ENCOUNTER — Encounter (HOSPITAL_COMMUNITY): Payer: Self-pay | Admitting: Orthopedic Surgery

## 2019-10-30 ENCOUNTER — Ambulatory Visit (HOSPITAL_COMMUNITY): Payer: Medicare PPO | Admitting: Anesthesiology

## 2019-10-30 ENCOUNTER — Other Ambulatory Visit: Payer: Self-pay

## 2019-10-30 DIAGNOSIS — F329 Major depressive disorder, single episode, unspecified: Secondary | ICD-10-CM | POA: Insufficient documentation

## 2019-10-30 DIAGNOSIS — Z87891 Personal history of nicotine dependence: Secondary | ICD-10-CM | POA: Insufficient documentation

## 2019-10-30 DIAGNOSIS — Z885 Allergy status to narcotic agent status: Secondary | ICD-10-CM | POA: Insufficient documentation

## 2019-10-30 DIAGNOSIS — Z881 Allergy status to other antibiotic agents status: Secondary | ICD-10-CM | POA: Diagnosis not present

## 2019-10-30 DIAGNOSIS — M1711 Unilateral primary osteoarthritis, right knee: Secondary | ICD-10-CM | POA: Diagnosis not present

## 2019-10-30 DIAGNOSIS — Z79899 Other long term (current) drug therapy: Secondary | ICD-10-CM | POA: Diagnosis not present

## 2019-10-30 DIAGNOSIS — J449 Chronic obstructive pulmonary disease, unspecified: Secondary | ICD-10-CM | POA: Insufficient documentation

## 2019-10-30 DIAGNOSIS — Z853 Personal history of malignant neoplasm of breast: Secondary | ICD-10-CM | POA: Diagnosis not present

## 2019-10-30 DIAGNOSIS — I10 Essential (primary) hypertension: Secondary | ICD-10-CM | POA: Diagnosis not present

## 2019-10-30 DIAGNOSIS — M25761 Osteophyte, right knee: Secondary | ICD-10-CM | POA: Diagnosis not present

## 2019-10-30 HISTORY — PX: TOTAL KNEE ARTHROPLASTY: SHX125

## 2019-10-30 LAB — TYPE AND SCREEN
ABO/RH(D): O POS
Antibody Screen: NEGATIVE

## 2019-10-30 SURGERY — ARTHROPLASTY, KNEE, TOTAL
Anesthesia: Spinal | Site: Knee | Laterality: Right

## 2019-10-30 MED ORDER — ALBUTEROL SULFATE HFA 108 (90 BASE) MCG/ACT IN AERS: 2.0000 | INHALATION_SPRAY | Freq: Four times a day (QID) | RESPIRATORY_TRACT | Status: DC | PRN

## 2019-10-30 MED ORDER — ROPIVACAINE HCL 7.5 MG/ML IJ SOLN
INTRAMUSCULAR | Status: DC | PRN
  Administered 2019-10-30: 20 mL via PERINEURAL

## 2019-10-30 MED ORDER — POLYETHYLENE GLYCOL 3350 17 G PO PACK: 17.0000 g | PACK | Freq: Every day | ORAL | Status: DC | PRN

## 2019-10-30 MED ORDER — ASPIRIN EC 325 MG PO TBEC: 325.0000 mg | DELAYED_RELEASE_TABLET | Freq: Two times a day (BID) | ORAL | 0 refills | Status: DC

## 2019-10-30 MED ORDER — POVIDONE-IODINE 10 % EX SWAB
2.0000 "application " | Freq: Once | CUTANEOUS | Status: AC
  Administered 2019-10-30: 2 via TOPICAL

## 2019-10-30 MED ORDER — FENTANYL CITRATE (PF) 100 MCG/2ML IJ SOLN
INTRAMUSCULAR | Status: AC
  Administered 2019-10-30: 50 ug via INTRAVENOUS
  Filled 2019-10-30: qty 2

## 2019-10-30 MED ORDER — MIRABEGRON ER 25 MG PO TB24
25.0000 mg | ORAL_TABLET | Freq: Every day | ORAL | Status: DC
  Administered 2019-10-31: 25 mg via ORAL
  Filled 2019-10-30: qty 1

## 2019-10-30 MED ORDER — METHOCARBAMOL 500 MG IVPB - SIMPLE MED
500.0000 mg | Freq: Four times a day (QID) | INTRAVENOUS | Status: DC | PRN
  Administered 2019-10-30: 500 mg via INTRAVENOUS
  Filled 2019-10-30: qty 50

## 2019-10-30 MED ORDER — METOPROLOL TARTRATE 25 MG PO TABS
25.0000 mg | ORAL_TABLET | Freq: Two times a day (BID) | ORAL | Status: DC
  Administered 2019-10-30 – 2019-10-31 (×2): 25 mg via ORAL
  Filled 2019-10-30 (×2): qty 1

## 2019-10-30 MED ORDER — PROPOFOL 500 MG/50ML IV EMUL
INTRAVENOUS | Status: AC
  Filled 2019-10-30: qty 100

## 2019-10-30 MED ORDER — SODIUM CHLORIDE 0.9 % IV SOLN: INTRAVENOUS | Status: DC

## 2019-10-30 MED ORDER — METHOCARBAMOL 500 MG IVPB - SIMPLE MED
INTRAVENOUS | Status: AC
  Filled 2019-10-30: qty 50

## 2019-10-30 MED ORDER — FENTANYL CITRATE (PF) 100 MCG/2ML IJ SOLN
INTRAMUSCULAR | Status: DC | PRN
  Administered 2019-10-30: 50 ug via INTRAVENOUS

## 2019-10-30 MED ORDER — ASPIRIN EC 325 MG PO TBEC
325.0000 mg | DELAYED_RELEASE_TABLET | Freq: Two times a day (BID) | ORAL | Status: DC
  Administered 2019-10-30 – 2019-10-31 (×2): 325 mg via ORAL
  Filled 2019-10-30 (×2): qty 1

## 2019-10-30 MED ORDER — OXYCODONE HCL 5 MG/5ML PO SOLN: 5.0000 mg | Freq: Once | ORAL | Status: DC | PRN

## 2019-10-30 MED ORDER — ONDANSETRON HCL 4 MG PO TABS: 4.0000 mg | ORAL_TABLET | Freq: Four times a day (QID) | ORAL | Status: DC | PRN

## 2019-10-30 MED ORDER — FENTANYL CITRATE (PF) 100 MCG/2ML IJ SOLN
INTRAMUSCULAR | Status: AC
  Filled 2019-10-30: qty 2

## 2019-10-30 MED ORDER — METHOCARBAMOL 500 MG PO TABS
500.0000 mg | ORAL_TABLET | Freq: Four times a day (QID) | ORAL | Status: DC | PRN
  Administered 2019-10-30 – 2019-10-31 (×3): 500 mg via ORAL
  Filled 2019-10-30 (×3): qty 1

## 2019-10-30 MED ORDER — MAGNESIUM CITRATE PO SOLN: 1.0000 | Freq: Once | ORAL | Status: DC | PRN

## 2019-10-30 MED ORDER — DIPHENHYDRAMINE HCL 12.5 MG/5ML PO ELIX: 12.5000 mg | ORAL_SOLUTION | ORAL | Status: DC | PRN

## 2019-10-30 MED ORDER — DEXAMETHASONE SODIUM PHOSPHATE 10 MG/ML IJ SOLN
INTRAMUSCULAR | Status: DC | PRN
  Administered 2019-10-30: 10 mg via INTRAVENOUS

## 2019-10-30 MED ORDER — UMECLIDINIUM-VILANTEROL 62.5-25 MCG/INH IN AEPB
1.0000 | INHALATION_SPRAY | Freq: Every day | RESPIRATORY_TRACT | Status: DC
  Administered 2019-10-31: 1 via RESPIRATORY_TRACT
  Filled 2019-10-30: qty 14

## 2019-10-30 MED ORDER — BUPIVACAINE LIPOSOME 1.3 % IJ SUSP
INTRAMUSCULAR | Status: DC | PRN
  Administered 2019-10-30: 20 mL

## 2019-10-30 MED ORDER — ALUM & MAG HYDROXIDE-SIMETH 200-200-20 MG/5ML PO SUSP: 30.0000 mL | ORAL | Status: DC | PRN

## 2019-10-30 MED ORDER — OXYCODONE HCL 5 MG PO TABS: 5.0000 mg | ORAL_TABLET | Freq: Once | ORAL | Status: DC | PRN

## 2019-10-30 MED ORDER — BUPIVACAINE HCL (PF) 0.25 % IJ SOLN
INTRAMUSCULAR | Status: DC | PRN
  Administered 2019-10-30: 30 mL

## 2019-10-30 MED ORDER — WATER FOR IRRIGATION, STERILE IR SOLN
Status: DC | PRN
  Administered 2019-10-30: 2000 mL

## 2019-10-30 MED ORDER — PROPOFOL 10 MG/ML IV BOLUS
INTRAVENOUS | Status: AC
  Filled 2019-10-30: qty 20

## 2019-10-30 MED ORDER — MIDAZOLAM HCL 2 MG/2ML IJ SOLN
INTRAMUSCULAR | Status: AC
  Administered 2019-10-30: 1 mg via INTRAVENOUS
  Filled 2019-10-30: qty 2

## 2019-10-30 MED ORDER — BUPROPION HCL ER (XL) 300 MG PO TB24
300.0000 mg | ORAL_TABLET | Freq: Every day | ORAL | Status: DC
  Administered 2019-10-31: 300 mg via ORAL
  Filled 2019-10-30: qty 1

## 2019-10-30 MED ORDER — ONDANSETRON HCL 4 MG/2ML IJ SOLN: 4.0000 mg | Freq: Once | INTRAMUSCULAR | Status: DC | PRN

## 2019-10-30 MED ORDER — 0.9 % SODIUM CHLORIDE (POUR BTL) OPTIME
TOPICAL | Status: DC | PRN
  Administered 2019-10-30: 1000 mL

## 2019-10-30 MED ORDER — PHENYLEPHRINE HCL-NACL 10-0.9 MG/250ML-% IV SOLN
INTRAVENOUS | Status: DC | PRN
  Administered 2019-10-30: 20 ug/min via INTRAVENOUS

## 2019-10-30 MED ORDER — CEFAZOLIN SODIUM-DEXTROSE 2-4 GM/100ML-% IV SOLN
INTRAVENOUS | Status: AC
  Filled 2019-10-30: qty 100

## 2019-10-30 MED ORDER — DEXAMETHASONE SODIUM PHOSPHATE 10 MG/ML IJ SOLN
10.0000 mg | Freq: Two times a day (BID) | INTRAMUSCULAR | Status: DC
  Administered 2019-10-30 – 2019-10-31 (×2): 10 mg via INTRAVENOUS
  Filled 2019-10-30 (×2): qty 1

## 2019-10-30 MED ORDER — TRANEXAMIC ACID-NACL 1000-0.7 MG/100ML-% IV SOLN
1000.0000 mg | INTRAVENOUS | Status: AC
  Administered 2019-10-30: 1000 mg via INTRAVENOUS

## 2019-10-30 MED ORDER — DOCUSATE SODIUM 100 MG PO CAPS
100.0000 mg | ORAL_CAPSULE | Freq: Two times a day (BID) | ORAL | Status: DC
  Administered 2019-10-30 – 2019-10-31 (×2): 100 mg via ORAL
  Filled 2019-10-30 (×2): qty 1

## 2019-10-30 MED ORDER — HYDROMORPHONE HCL 1 MG/ML IJ SOLN
0.5000 mg | INTRAMUSCULAR | Status: DC | PRN
  Administered 2019-10-30: 1 mg via INTRAVENOUS
  Filled 2019-10-30: qty 1

## 2019-10-30 MED ORDER — ONDANSETRON HCL 4 MG/2ML IJ SOLN
INTRAMUSCULAR | Status: DC | PRN
  Administered 2019-10-30: 4 mg via INTRAVENOUS

## 2019-10-30 MED ORDER — LACTATED RINGERS IV SOLN: INTRAVENOUS | Status: DC

## 2019-10-30 MED ORDER — MIDAZOLAM HCL 2 MG/2ML IJ SOLN
1.0000 mg | INTRAMUSCULAR | Status: DC
  Administered 2019-10-30: .5 mg via INTRAVENOUS

## 2019-10-30 MED ORDER — MIDAZOLAM HCL 2 MG/2ML IJ SOLN
INTRAMUSCULAR | Status: AC
  Filled 2019-10-30: qty 2

## 2019-10-30 MED ORDER — SODIUM CHLORIDE 0.9% FLUSH
INTRAVENOUS | Status: DC | PRN
  Administered 2019-10-30: 50 mL

## 2019-10-30 MED ORDER — CELECOXIB 200 MG PO CAPS
200.0000 mg | ORAL_CAPSULE | Freq: Two times a day (BID) | ORAL | Status: DC
  Administered 2019-10-30 – 2019-10-31 (×3): 200 mg via ORAL
  Filled 2019-10-30 (×3): qty 1

## 2019-10-30 MED ORDER — ONDANSETRON HCL 4 MG/2ML IJ SOLN: 4.0000 mg | Freq: Four times a day (QID) | INTRAMUSCULAR | Status: DC | PRN

## 2019-10-30 MED ORDER — TRANEXAMIC ACID-NACL 1000-0.7 MG/100ML-% IV SOLN
INTRAVENOUS | Status: AC
  Filled 2019-10-30: qty 100

## 2019-10-30 MED ORDER — FENTANYL CITRATE (PF) 100 MCG/2ML IJ SOLN: 50.0000 ug | INTRAMUSCULAR | Status: DC

## 2019-10-30 MED ORDER — FENTANYL CITRATE (PF) 100 MCG/2ML IJ SOLN
25.0000 ug | INTRAMUSCULAR | Status: DC | PRN
  Administered 2019-10-30: 50 ug via INTRAVENOUS

## 2019-10-30 MED ORDER — MEPIVACAINE HCL (PF) 2 % IJ SOLN
INTRAMUSCULAR | Status: AC
  Filled 2019-10-30: qty 20

## 2019-10-30 MED ORDER — SODIUM CHLORIDE (PF) 0.9 % IJ SOLN
INTRAMUSCULAR | Status: AC
  Filled 2019-10-30: qty 50

## 2019-10-30 MED ORDER — ALBUTEROL SULFATE (2.5 MG/3ML) 0.083% IN NEBU: 2.5000 mg | INHALATION_SOLUTION | Freq: Four times a day (QID) | RESPIRATORY_TRACT | Status: DC | PRN

## 2019-10-30 MED ORDER — OXYCODONE HCL 5 MG PO TABS
5.0000 mg | ORAL_TABLET | ORAL | Status: DC | PRN
  Administered 2019-10-30: 5 mg via ORAL
  Administered 2019-10-30: 10 mg via ORAL
  Administered 2019-10-31 (×2): 5 mg via ORAL
  Filled 2019-10-30: qty 2
  Filled 2019-10-30 (×3): qty 1

## 2019-10-30 MED ORDER — HYDROCHLOROTHIAZIDE 12.5 MG PO CAPS
12.5000 mg | ORAL_CAPSULE | Freq: Every day | ORAL | Status: DC
  Administered 2019-10-31: 12.5 mg via ORAL
  Filled 2019-10-30: qty 1

## 2019-10-30 MED ORDER — ALBUTEROL SULFATE HFA 108 (90 BASE) MCG/ACT IN AERS
INHALATION_SPRAY | RESPIRATORY_TRACT | Status: DC | PRN
  Administered 2019-10-30: 3 via RESPIRATORY_TRACT

## 2019-10-30 MED ORDER — TRANEXAMIC ACID-NACL 1000-0.7 MG/100ML-% IV SOLN
1000.0000 mg | Freq: Once | INTRAVENOUS | Status: AC
  Administered 2019-10-30: 1000 mg via INTRAVENOUS
  Filled 2019-10-30: qty 100

## 2019-10-30 MED ORDER — CELECOXIB 200 MG PO CAPS: 200.0000 mg | ORAL_CAPSULE | Freq: Every day | ORAL | 0 refills | Status: AC

## 2019-10-30 MED ORDER — BISACODYL 5 MG PO TBEC: 5.0000 mg | DELAYED_RELEASE_TABLET | Freq: Every day | ORAL | Status: DC | PRN

## 2019-10-30 MED ORDER — CEFAZOLIN SODIUM-DEXTROSE 2-4 GM/100ML-% IV SOLN
2.0000 g | INTRAVENOUS | Status: AC
  Administered 2019-10-30: 2 g via INTRAVENOUS

## 2019-10-30 MED ORDER — GABAPENTIN 300 MG PO CAPS
300.0000 mg | ORAL_CAPSULE | Freq: Two times a day (BID) | ORAL | Status: DC
  Administered 2019-10-30 – 2019-10-31 (×3): 300 mg via ORAL
  Filled 2019-10-30 (×3): qty 1

## 2019-10-30 MED ORDER — BUPIVACAINE HCL (PF) 0.25 % IJ SOLN
INTRAMUSCULAR | Status: AC
  Filled 2019-10-30: qty 30

## 2019-10-30 MED ORDER — SODIUM CHLORIDE 0.9 % IR SOLN
Status: DC | PRN
  Administered 2019-10-30: 1000 mL

## 2019-10-30 MED ORDER — DOCUSATE SODIUM 100 MG PO CAPS: 100.0000 mg | ORAL_CAPSULE | Freq: Two times a day (BID) | ORAL | 0 refills | Status: DC

## 2019-10-30 MED ORDER — ACETAMINOPHEN 325 MG PO TABS: 325.0000 mg | ORAL_TABLET | Freq: Four times a day (QID) | ORAL | Status: DC | PRN

## 2019-10-30 MED ORDER — CEFAZOLIN SODIUM-DEXTROSE 2-4 GM/100ML-% IV SOLN
2.0000 g | Freq: Four times a day (QID) | INTRAVENOUS | Status: AC
  Administered 2019-10-30 (×2): 2 g via INTRAVENOUS
  Filled 2019-10-30 (×2): qty 100

## 2019-10-30 MED ORDER — PROPOFOL 500 MG/50ML IV EMUL
INTRAVENOUS | Status: DC | PRN
  Administered 2019-10-30: 75 ug/kg/min via INTRAVENOUS

## 2019-10-30 MED ORDER — CHLORHEXIDINE GLUCONATE 4 % EX LIQD: 60.0000 mL | Freq: Once | CUTANEOUS | Status: DC

## 2019-10-30 SURGICAL SUPPLY — 50 items
ATTUNE MED DOME PAT 38 KNEE (Knees) ×2 IMPLANT
ATTUNE PSFEM RTSZ5 NARCEM KNEE (Femur) ×2 IMPLANT
ATTUNE PSRP INSR SZ5 6 KNEE (Insert) ×2 IMPLANT
BAG ZIPLOCK 12X15 (MISCELLANEOUS) ×2 IMPLANT
BASE TIBIAL ROT PLAT SZ 5 KNEE (Knees) ×1 IMPLANT
BENZOIN TINCTURE PRP APPL 2/3 (GAUZE/BANDAGES/DRESSINGS) ×2 IMPLANT
BLADE SAGITTAL 25.0X1.19X90 (BLADE) ×2 IMPLANT
BLADE SAW SGTL 13.0X1.19X90.0M (BLADE) ×2 IMPLANT
BLADE SURG SZ10 CARB STEEL (BLADE) ×4 IMPLANT
BNDG ELASTIC 6X5.8 VLCR STR LF (GAUZE/BANDAGES/DRESSINGS) ×4 IMPLANT
BOOTIES KNEE HIGH SLOAN (MISCELLANEOUS) ×2 IMPLANT
BOWL SMART MIX CTS (DISPOSABLE) ×2 IMPLANT
CEMENT HV SMART SET (Cement) ×4 IMPLANT
COVER SURGICAL LIGHT HANDLE (MISCELLANEOUS) ×2 IMPLANT
COVER WAND RF STERILE (DRAPES) IMPLANT
CUFF TOURN SGL QUICK 34 (TOURNIQUET CUFF) ×1
CUFF TRNQT CYL 34X4.125X (TOURNIQUET CUFF) ×1 IMPLANT
DECANTER SPIKE VIAL GLASS SM (MISCELLANEOUS) ×4 IMPLANT
DRAPE U-SHAPE 47X51 STRL (DRAPES) ×2 IMPLANT
DRSG AQUACEL AG ADV 3.5X10 (GAUZE/BANDAGES/DRESSINGS) ×2 IMPLANT
DURAPREP 26ML APPLICATOR (WOUND CARE) ×2 IMPLANT
ELECT REM PT RETURN 15FT ADLT (MISCELLANEOUS) ×2 IMPLANT
GLOVE BIOGEL PI IND STRL 8 (GLOVE) ×2 IMPLANT
GLOVE BIOGEL PI INDICATOR 8 (GLOVE) ×2
GLOVE ECLIPSE 7.5 STRL STRAW (GLOVE) ×4 IMPLANT
GOWN STRL REUS W/TWL XL LVL3 (GOWN DISPOSABLE) ×4 IMPLANT
HANDPIECE INTERPULSE COAX TIP (DISPOSABLE) ×1
HOLDER FOLEY CATH W/STRAP (MISCELLANEOUS) IMPLANT
HOOD PEEL AWAY FLYTE STAYCOOL (MISCELLANEOUS) ×6 IMPLANT
KIT TURNOVER KIT A (KITS) IMPLANT
MANIFOLD NEPTUNE II (INSTRUMENTS) ×2 IMPLANT
NEEDLE HYPO 22GX1.5 SAFETY (NEEDLE) ×2 IMPLANT
NS IRRIG 1000ML POUR BTL (IV SOLUTION) ×2 IMPLANT
PACK ICE MAXI GEL EZY WRAP (MISCELLANEOUS) ×2 IMPLANT
PACK TOTAL KNEE CUSTOM (KITS) ×2 IMPLANT
PADDING CAST COTTON 6X4 STRL (CAST SUPPLIES) ×2 IMPLANT
PENCIL SMOKE EVACUATOR (MISCELLANEOUS) IMPLANT
PROTECTOR NERVE ULNAR (MISCELLANEOUS) ×2 IMPLANT
SET HNDPC FAN SPRY TIP SCT (DISPOSABLE) ×1 IMPLANT
STRIP CLOSURE SKIN 1/2X4 (GAUZE/BANDAGES/DRESSINGS) ×2 IMPLANT
SUT MNCRL AB 3-0 PS2 18 (SUTURE) ×2 IMPLANT
SUT VIC AB 0 CT1 36 (SUTURE) ×2 IMPLANT
SUT VIC AB 1 CT1 36 (SUTURE) ×4 IMPLANT
SYR CONTROL 10ML LL (SYRINGE) ×4 IMPLANT
TIBIAL BASE ROT PLAT SZ 5 KNEE (Knees) ×2 IMPLANT
TRAY FOLEY MTR SLVR 16FR STAT (SET/KITS/TRAYS/PACK) ×2 IMPLANT
WATER STERILE IRR 1000ML POUR (IV SOLUTION) ×4 IMPLANT
WRAP KNEE MAXI GEL POST OP (GAUZE/BANDAGES/DRESSINGS) ×2 IMPLANT
YANKAUER SUCT BULB TIP 10FT TU (MISCELLANEOUS) ×2 IMPLANT
YANKAUER SUCT BULB TIP NO VENT (SUCTIONS) ×2 IMPLANT

## 2019-10-30 NOTE — Evaluation (Signed)
Physical Therapy Evaluation Patient Details Name: Melanie Flores MRN: [id number] DOB: 1945/09/13 Today's Date: 10/30/2019   History of Present Illness  Patient is 74 y.o. female s/p Rt TKA on 10/30/19 with PMH significant for HTN, dyspnea, COPD, ovarian and breast cancer, OA.    Clinical Impression  Melanie Flores is a 74 y.o. female POD 0 s/p Rt TKA. Patient reports independence with mobility at baseline. Patient is now limited by functional impairments (see PT problem list below) and requires min assist for transfers and gait with RW. Patient was able to ambulate ~40 feet with RW and min assist. Patient instructed in exercise to facilitate ROM and circulation. Patient will benefit from continued skilled PT interventions to address impairments and progress towards PLOF. Acute PT will follow to progress mobility and stair training in preparation for safe discharge home.     Follow Up Recommendations Follow surgeon's recommendation for DC plan and follow-up therapies;Home health PT(pt reprots she is getting HHPT )    Equipment Recommendations  None recommended by PT    Recommendations for Other Services       Precautions / Restrictions Precautions Precautions: Fall Restrictions Weight Bearing Restrictions: No      Mobility  Bed Mobility Overal bed mobility: Needs Assistance Bed Mobility: Supine to Sit     Supine to sit: Min assist;HOB elevated     General bed mobility comments: cues to reach for bed rail, assist for Rt LE mobility to move to EOB  Transfers Overall transfer level: Needs assistance Equipment used: Rolling walker (2 wheeled) Transfers: Sit to/from Stand Sit to Stand: Min assist         General transfer comment: cues for hand placement/technique with RW, assist to initiate power up and steady with rise  Ambulation/Gait Ambulation/Gait assistance: Min assist Gait Distance (Feet): 40 Feet Assistive device: Rolling walker (2 wheeled) Gait  Pattern/deviations: Step-to pattern;Decreased step length - left;Decreased stance time - right;Decreased weight shift to right Gait velocity: decreased   General Gait Details: cues for sequencing step pattern in RW, no overt LOB noted.   Stairs            Wheelchair Mobility    Modified Rankin (Stroke Patients Only)       Balance Overall balance assessment: Needs assistance Sitting-balance support: Feet supported Sitting balance-Leahy Scale: Good     Standing balance support: During functional activity;Bilateral upper extremity supported Standing balance-Leahy Scale: Poor            Pertinent Vitals/Pain Pain Assessment: 0-10 Pain Score: 4  Pain Location: Rt knee Pain Descriptors / Indicators: Aching Pain Intervention(s): Limited activity within patient's tolerance;Monitored during session;Repositioned;Premedicated before session;Ice applied    Home Living Family/patient expects to be discharged to:: Private residence Living Arrangements: Spouse/significant other Available Help at Discharge: Friend(s);Family;Available 24 hours/day Type of Home: House Home Access: Stairs to enter Entrance Stairs-Rails: None Entrance Stairs-Number of Steps: 2(pt has a ramp available) Home Layout: One level Home Equipment: Walker - 2 wheels      Prior Function Level of Independence: Independent               Hand Dominance   Dominant Hand: Right    Extremity/Trunk Assessment   Upper Extremity Assessment Upper Extremity Assessment: Overall WFL for tasks assessed    Lower Extremity Assessment Lower Extremity Assessment: RLE deficits/detail RLE Deficits / Details: good quad activation, no extensor lag with SLR, 4/5 for strength with MMT RLE Sensation: WNL RLE Coordination: WNL  Cervical / Trunk Assessment Cervical / Trunk Assessment: Normal  Communication   Communication: No difficulties  Cognition Arousal/Alertness: Awake/alert Behavior During Therapy:  WFL for tasks assessed/performed Overall Cognitive Status: Within Functional Limits for tasks assessed         General Comments      Exercises Total Joint Exercises Ankle Circles/Pumps: AROM;Both;10 reps;Seated Quad Sets: AROM;Right;5 reps;Seated Heel Slides: AROM;5 reps;Seated;Right   Assessment/Plan    PT Assessment Patient needs continued PT services  PT Problem List Decreased strength;Decreased range of motion;Decreased activity tolerance;Decreased balance;Decreased mobility;Decreased knowledge of use of DME       PT Treatment Interventions DME instruction;Gait training;Stair training;Functional mobility training;Therapeutic activities;Therapeutic exercise;Balance training;Patient/family education    PT Goals (Current goals can be found in the Care Plan section)  Acute Rehab PT Goals Patient Stated Goal: to be able to walk around the grocery store without pain PT Goal Formulation: With patient Time For Goal Achievement: 11/06/19 Potential to Achieve Goals: Good    Frequency 7X/week    AM-PAC PT "6 Clicks" Mobility  Outcome Measure Help needed turning from your back to your side while in a flat bed without using bedrails?: A Little Help needed moving from lying on your back to sitting on the side of a flat bed without using bedrails?: A Little Help needed moving to and from a bed to a chair (including a wheelchair)?: A Little Help needed standing up from a chair using your arms (e.g., wheelchair or bedside chair)?: A Little Help needed to walk in hospital room?: A Little Help needed climbing 3-5 steps with a railing? : A Little 6 Click Score: 18    End of Session Equipment Utilized During Treatment: Gait belt Activity Tolerance: Patient tolerated treatment well Patient left: in chair;with chair alarm set;with call bell/phone within reach;with family/visitor present Nurse Communication: Mobility status PT Visit Diagnosis: Muscle weakness (generalized)  (M62.81);Difficulty in walking, not elsewhere classified (R26.2)    Time: [id number] PT Time Calculation (min) (ACUTE ONLY): 26 min   Charges:   PT Evaluation $PT Eval Low Complexity: 1 Low PT Treatments $Therapeutic Exercise: 8-22 mins       Verner Mould, DPT Physical Therapist with [facility] [phone]  10/30/2019 3:35 PM

## 2019-10-30 NOTE — Anesthesia Procedure Notes (Addendum)
Spinal  Patient location during procedure: OR End time: 10/30/2019 8:49 AM Staffing Performed: resident/CRNA  Resident/CRNA: Lissa Morales, CRNA Preanesthetic Checklist Completed: patient identified, IV checked, site marked, risks and benefits discussed, surgical consent, monitors and equipment checked, pre-op evaluation and timeout performed Spinal Block Patient position: sitting Prep: DuraPrep Patient monitoring: heart rate, continuous pulse ox and blood pressure Approach: midline Location: L3-4 Injection technique: single-shot Needle Needle type: Pencan  Needle gauge: 24 G Needle length: 9 cm Additional Notes Expiration date of kit checked and confirmed. Patient tolerated procedure well, without complications.

## 2019-10-30 NOTE — Anesthesia Procedure Notes (Signed)
Anesthesia Regional Block: Adductor canal block   Pre-Anesthetic Checklist: ,, timeout performed, Correct Patient, Correct Site, Correct Laterality, Correct Procedure, Correct Position, site marked, Risks and benefits discussed,  Surgical consent,  Pre-op evaluation,  At surgeon's request and post-op pain management  Laterality: Right  Prep: chloraprep       Needles:  Injection technique: Single-shot  Needle Type: Echogenic Needle     Needle Length: 10cm  Needle Gauge: 21     Additional Needles:   Narrative:  Start time: 10/30/2019 7:58 AM End time: 10/30/2019 8:01 AM Injection made incrementally with aspirations every 5 mL.  Performed by: Personally  Anesthesiologist: Audry Pili, MD  Additional Notes: No pain on injection. No increased resistance to injection. Injection made in 5cc increments. Good needle visualization. Patient tolerated the procedure well.

## 2019-10-30 NOTE — Transfer of Care (Signed)
Immediate Anesthesia Transfer of Care Note  Patient: Melanie Flores  Procedure(s) Performed: TOTAL KNEE ARTHROPLASTY (Right Knee)  Patient Location: PACU  Anesthesia Type:Spinal  Level of Consciousness: awake, alert , oriented and patient cooperative  Airway & Oxygen Therapy: Patient Spontanous Breathing and Patient connected to face mask oxygen  Post-op Assessment: Report given to RN and Post -op Vital signs reviewed and stable  Post vital signs: stable  Last Vitals:  Vitals Value Taken Time  BP 141/64 10/30/19 1048  Temp    Pulse 58 10/30/19 1053  Resp 13 10/30/19 1053  SpO2 100 % 10/30/19 1053  Vitals shown include unvalidated device data.  Last Pain:  Vitals:   10/30/19 0802  TempSrc:   PainSc: 0-No pain         Complications: No apparent anesthesia complications

## 2019-10-30 NOTE — Anesthesia Preprocedure Evaluation (Addendum)
Anesthesia Evaluation  Patient identified by MRN, date of birth, ID band Patient awake    Reviewed: Allergy & Precautions, NPO status , Patient's Chart, lab work & pertinent test results, reviewed documented beta blocker date and time   History of Anesthesia Complications Negative for: history of anesthetic complications  Airway Mallampati: II  TM Distance: >3 FB Neck ROM: Full    Dental  (+) Dental Advisory Given, Teeth Intact   Pulmonary COPD,  COPD inhaler, former smoker,    Pulmonary exam normal        Cardiovascular hypertension, Pt. on medications and Pt. 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    Anesthesia Other Findings Covid neg 10/27/19   Reproductive/Obstetrics  Ovarian and breast cancer                             Anesthesia Physical Anesthesia Plan  ASA: II  Anesthesia Plan: Spinal   Post-op Pain Management:  Regional for Post-op pain   Induction:   PONV Risk Score and Plan: 2 and Treatment may vary due to age or medical condition and Propofol infusion  Airway Management Planned: Natural Airway and Simple Face Mask  Additional Equipment: None  Intra-op Plan:   Post-operative Plan:   Informed Consent: I have reviewed the patients History and Physical, chart, labs and discussed the procedure including the risks, benefits and alternatives for the proposed anesthesia with the patient or authorized representative who has indicated his/her understanding and acceptance.       Plan Discussed with: CRNA and Anesthesiologist  Anesthesia Plan  Comments: (Labs reviewed, platelets acceptable. Discussed risks and benefits of spinal, including spinal/epidural hematoma, infection, failed block, and PDPH. Patient expressed understanding and wished to proceed. )       Anesthesia Quick Evaluation

## 2019-10-30 NOTE — Progress Notes (Signed)
Pt. s/u with CPAP, made aware to notify if needed/RT plans to recheck on rounds.

## 2019-10-30 NOTE — Discharge Instructions (Signed)

## 2019-10-30 NOTE — Anesthesia Procedure Notes (Signed)
Procedure Name: MAC Date/Time: 10/30/2019 8:42 AM Performed by: Lissa Morales, CRNA Pre-anesthesia Checklist: Patient identified, Emergency Drugs available, Suction available, Patient being monitored and Timeout performed Patient Re-evaluated:Patient Re-evaluated prior to induction Oxygen Delivery Method: Simple face mask Placement Confirmation: positive ETCO2

## 2019-10-30 NOTE — Progress Notes (Signed)
Assisted Dr. Brock with right, ultrasound guided, adductor canal block. Side rails up, monitors on throughout procedure. See vital signs in flow sheet. Tolerated Procedure well.  

## 2019-10-30 NOTE — Anesthesia Postprocedure Evaluation (Signed)
Anesthesia Post Note  Patient: Melanie Flores  Procedure(s) Performed: TOTAL KNEE ARTHROPLASTY (Right Knee)     Patient location during evaluation: PACU Anesthesia Type: Spinal Level of consciousness: awake and alert Pain management: pain level controlled Vital Signs Assessment: post-procedure vital signs reviewed and stable Respiratory status: spontaneous breathing and respiratory function stable Cardiovascular status: blood pressure returned to baseline and stable Postop Assessment: spinal receding and no apparent nausea or vomiting Anesthetic complications: no    Last Vitals:  Vitals:   10/30/19 1215 10/30/19 1234  BP: 125/70 116/71  Pulse:  (!) 49  Resp:  20  Temp: 36.6 C 36.4 C  SpO2:  100%    Last Pain:  Vitals:   10/30/19 1215  TempSrc:   PainSc: [place] Johnathan Heskett

## 2019-10-30 NOTE — H&P (Signed)
TOTAL KNEE ADMISSION H&P  Patient is being admitted for right total knee arthroplasty.  Subjective:  Chief Complaint:right knee pain.  HPI: Melanie Flores, 74 y.o. female, has a history of pain and functional disability in the right knee due to arthritis and has failed non-surgical conservative treatments for greater than 12 weeks to includeNSAID's and/or analgesics, corticosteriod injections, viscosupplementation injections, flexibility and strengthening excercises and activity modification.  Onset of symptoms was gradual, starting 4 years ago with gradually worsening course since that time. The patient noted no past surgery on the right knee(s).  Patient currently rates pain in the right knee(s) at 9 out of 10 with activity. Patient has night pain, worsening of pain with activity and weight bearing, pain that interferes with activities of daily living, pain with passive range of motion and joint swelling.  Patient has evidence of periarticular osteophytes and joint space narrowing by imaging studies. This patient has had failure of all reasonable conservative care. There is no active infection.  Patient Active Problem List   Diagnosis Date Noted  . Primary osteoarthritis of right knee 09/03/2018  . Fracture of great toe, left, closed 08/05/2018  . 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 HYPERTENSION 06/24/2008   Past Medical History:  Diagnosis Date  . Arthritis   . Breast cancer ([place])   . Cancer ([place])    ovarian, breast  . Chronic bronchitis ([place])   . COPD (chronic obstructive pulmonary disease) ([place])   . Depression   . Dyspnea    exertion  . Hypertension     Past Surgical History:  Procedure Laterality Date  . ABDOMINAL HYSTERECTOMY    . BREAST SURGERY    . CESAREAN SECTION    . CHOLECYSTECTOMY    . ELBOW LIGAMENT RECONSTRUCTION Right   . KNEE SURGERY    . SPLENECTOMY, TOTAL    . TONSILLECTOMY      Current Facility-Administered Medications  Medication Dose Route Frequency  Provider Last Rate Last Admin  . bupivacaine liposome (EXPAREL) 1.3 % injection 266 mg  20 mL Other Once Dorna Leitz, MD      . ceFAZolin (ANCEF) IVPB 2g/100 mL premix  2 g Intravenous On Call to OR Dorna Leitz, MD      . chlorhexidine (HIBICLENS) 4 % liquid 4 application  60 mL Topical Once Dorna Leitz, MD      . povidone-iodine 10 % swab 2 application  2 application Topical Once Dorna Leitz, MD      . tranexamic acid (CYKLOKAPRON) IVPB 1,000 mg  1,000 mg Intravenous To OR Dorna Leitz, MD       Allergies  Allergen Reactions  . Levaquin [Levofloxacin] Nausea And Vomiting  . Morphine And Related Itching  . Varenicline Other (See Comments)    Personality change "went crazy"    Social History   Tobacco Use  . Smoking status: Former Smoker    Packs/day: 0.50    Types: Cigarettes  . Smokeless tobacco: Never Used  Substance Use Topics  . Alcohol use: Yes    Comment: 1/2 case per week     No family history on file.   Review of Systems ROS: I have reviewed the patient's review of systems thoroughly and there are no positive responses as relates to the HPI. Objective:  Physical Exam  Vital signs in last 24 hours:   Well-developed well-nourished patient in no acute distress. Alert and oriented x3 HEENT:within normal limits Cardiac: Regular rate and rhythm Pulmonary: Lungs clear to auscultation Abdomen: Soft and  nontender.  Normal active bowel sounds  Musculoskeletal: (R KNEE: painful rom limited rom  Valgus malalignment trace effusion no instability Labs: Recent Results (from the past 2160 hour(s))  POC SARS Coronavirus 2 Ag-ED - Nasal Swab (BD Veritor Kit)     Status: None   Collection Time: 08/11/19  9:17 AM  Result Value Ref Range   SARS Coronavirus 2 Ag Negative Negative  Novel Coronavirus, NAA ([facility])     Status: None   Collection Time: 08/11/19  9:33 AM   Specimen: Nasopharyngeal(NP) swabs in vial transport medium   NASOPHARYNGE  Result Value Ref Range    SARS-CoV-2, NAA Not Detected Not Detected    Comment: This nucleic acid amplification test was developed and its performance characteristics determined by [employer]. Nucleic acid amplification tests include PCR and TMA. This test has not been FDA cleared or approved. This test has been authorized by FDA under an Emergency Use Authorization (EUA). This test is only authorized for the duration of time the declaration that circumstances exist justifying the authorization of the emergency use of in vitro diagnostic tests for detection of SARS-CoV-2 virus and/or diagnosis of COVID-19 infection under section 564(b)(1) of the Act, 21 U.S.C. 973ZHG-9(J) (1), unless the authorization is terminated or revoked sooner. When diagnostic testing is negative, the possibility of a false negative result should be considered in the context of a patient's recent exposures and the presence of clinical signs and symptoms consistent with COVID-19. An individual without symptoms of COVID-19 and who is not shedding SARS-CoV-2 virus would  expect to have a negative (not detected) result in this assay.   ABO/Rh     Status: None   Collection Time: 10/26/19  1:04 PM  Result Value Ref Range   ABO/RH(D)      O POS Performed at [facility], [place] [address] [place], [place] [address]   Surgical pcr screen     Status: None   Collection Time: 10/26/19  1:15 PM   Specimen: Nasal Mucosa; Nasal Swab  Result Value Ref Range   MRSA, PCR NEGATIVE NEGATIVE   Staphylococcus aureus NEGATIVE NEGATIVE    Comment: (NOTE) The Xpert SA Assay (FDA approved for NASAL specimens in patients 85 years of age and older), is one component of a comprehensive surveillance program. It is not intended to diagnose infection nor to guide or monitor treatment. Performed at [facility], [place] [address], [place], [place] [address]   APTT     Status: None   Collection Time: 10/26/19  1:15 PM   Result Value Ref Range   aPTT 27 24 - 36 seconds    Comment: Performed at [facility], [place] [address], [place], [place] [address]  CBC WITH DIFFERENTIAL     Status: None   Collection Time: 10/26/19  1:15 PM  Result Value Ref Range   WBC 6.9 4.0 - 10.5 K/uL   RBC 4.50 3.87 - 5.11 MIL/uL   Hemoglobin 14.1 12.0 - 15.0 g/dL   HCT 42.0 36.0 - 46.0 %   MCV 93.3 80.0 - 100.0 fL   MCH 31.3 26.0 - 34.0 pg   MCHC 33.6 30.0 - 36.0 g/dL   RDW 14.3 11.5 - 15.5 %   Platelets 360 150 - 400 K/uL   nRBC 0.0 0.0 - 0.2 %   Neutrophils Relative % 59 %   Neutro Abs 4.1 1.7 - 7.7 K/uL   Lymphocytes Relative 25 %   Lymphs Abs 1.8 0.7 -  4.0 K/uL   Monocytes Relative 12 %   Monocytes Absolute 0.8 0.1 - 1.0 K/uL   Eosinophils Relative 3 %   Eosinophils Absolute 0.2 0.0 - 0.5 K/uL   Basophils Relative 1 %   Basophils Absolute 0.1 0.0 - 0.1 K/uL   Immature Granulocytes 0 %   Abs Immature Granulocytes 0.02 0.00 - 0.07 K/uL    Comment: Performed at [facility], [place] [address], [place], [place] [address]  Comprehensive metabolic panel     Status: Abnormal   Collection Time: 10/26/19  1:15 PM  Result Value Ref Range   Sodium 136 135 - 145 mmol/L   Potassium 3.8 3.5 - 5.1 mmol/L   Chloride 98 98 - 111 mmol/L   CO2 30 22 - 32 mmol/L   Glucose, Bld 105 (H) 70 - 99 mg/dL    Comment: Glucose reference range applies only to samples taken after fasting for at least 8 hours.   BUN 17 8 - 23 mg/dL   Creatinine, Ser 0.80 0.44 - 1.00 mg/dL   Calcium 10.1 8.9 - 10.3 mg/dL   Total Protein 7.1 6.5 - 8.1 g/dL   Albumin 4.2 3.5 - 5.0 g/dL   AST 25 15 - 41 U/L   ALT 23 0 - 44 U/L   Alkaline Phosphatase 71 38 - 126 U/L   Total Bilirubin 0.5 0.3 - 1.2 mg/dL   GFR calc non Af Amer >60 >60 mL/min   GFR calc Af Amer >60 >60 mL/min   Anion gap 8 5 - 15    Comment: Performed at [facility], [place] [address] [place], [place] [address]  Protime-INR     Status:  None   Collection Time: 10/26/19  1:15 PM  Result Value Ref Range   Prothrombin Time 12.2 11.4 - 15.2 seconds   INR 0.9 0.8 - 1.2    Comment: (NOTE) INR goal varies based on device and disease states. Performed at [facility], [place] [address], [place], [place] [address]   Type and screen Order type and screen if day of surgery is less than 15 days from draw of preadmission visit or order morning of surgery if day of surgery is greater than 6 days from preadmission visit.     Status: None   Collection Time: 10/26/19  1:15 PM  Result Value Ref Range   ABO/RH(D) O POS    Antibody Screen NEG    Sample Expiration 11/09/2019,2359    Extend sample reason      NO TRANSFUSIONS OR PREGNANCY IN THE PAST 3 MONTHS Performed at [facility], [place] [address], [place],  [address]   Urinalysis, Routine w reflex microscopic     Status: None   Collection Time: 10/26/19  1:15 PM  Result Value Ref Range   Color, Urine YELLOW YELLOW   APPearance CLEAR CLEAR   Specific Gravity, Urine 1.006 1.005 - 1.030   pH 7.0 5.0 - 8.0   Glucose, UA NEGATIVE NEGATIVE mg/dL   Hgb urine dipstick NEGATIVE NEGATIVE   Bilirubin Urine NEGATIVE NEGATIVE   Ketones, ur NEGATIVE NEGATIVE mg/dL   Protein, ur NEGATIVE NEGATIVE mg/dL   Nitrite NEGATIVE NEGATIVE   Leukocytes,Ua NEGATIVE NEGATIVE    Comment: Performed at [place] [address], [place], [place] [address]  SARS CORONAVIRUS 2 (TAT 6-24 HRS) Nasopharyngeal Nasopharyngeal Swab     Status: None   Collection Time: 10/27/19 10:25 AM   Specimen: Nasopharyngeal Swab  Result Value Ref Range   SARS Coronavirus 2 NEGATIVE NEGATIVE    Comment: (NOTE) SARS-CoV-2 target nucleic acids are NOT DETECTED. The SARS-CoV-2 RNA is generally detectable in upper and lower respiratory specimens during the acute phase of infection. Negative results do not preclude SARS-CoV-2 infection, do not rule out co-infections  with other pathogens, and should not be used as the sole basis for treatment or other patient management decisions. Negative results must be combined with clinical observations, patient history, and epidemiological information. The expected result is Negative. Fact Sheet for Patients: SugarRoll.be Fact Sheet for Healthcare Providers: https://www.woods-mathews.com/ This test is not yet approved or cleared by the [place] FDA and  has been authorized for detection and/or diagnosis of SARS-CoV-2 by FDA under an Emergency Use Authorization (EUA). This EUA will remain  in effect (meaning this test can be used) for the duration of the COVID-19 declaration under Section 56 4(b)(1) of the Act, 21 U.S.C. section 360bbb-3(b)(1), unless the authorization is terminated or revoked sooner. Performed at [facility], [place] [address], [place], [place] [address]     Estimated body mass index is 28.79 kg/m as calculated from the following:   Height as of 10/26/19: _0  (1.651 m).   Weight as of 10/26/19: 78.5 kg.   Imaging Review Plain radiographs demonstrate severe degenerative joint disease of the right knee(s). The overall alignment ismild valgus. The bone quality appears to be poor for age and reported activity level.      Assessment/Plan:  End stage arthritis, right knee   The patient history, physical examination, clinical judgment of the provider and imaging studies are consistent with end stage degenerative joint disease of the right knee(s) and total knee arthroplasty is deemed medically necessary. The treatment options including medical management, injection therapy arthroscopy and arthroplasty were discussed at length. The risks and benefits of total knee arthroplasty were presented and reviewed. The risks due to aseptic loosening, infection, stiffness, patella tracking problems, thromboembolic complications and other imponderables were  discussed. The patient acknowledged the explanation, agreed to proceed with the plan and consent was signed. Patient is being admitted for inpatient treatment for surgery, pain control, PT, OT, prophylactic antibiotics, VTE prophylaxis, progressive ambulation and ADL's and discharge planning. The patient is planning to be discharged home with home health services     Patient's anticipated LOS is less than 2 midnights, meeting these requirements: - Younger than 79 - Lives within 1 hour of care - Has a competent adult at home to recover with post-op recover - NO history of  - Chronic pain requiring opiods  - Diabetes  - Coronary Artery Disease  - Heart failure  - Heart attack  - Stroke  - DVT/VTE  - Cardiac arrhythmia  - Respiratory Failure/COPD  - Renal failure  - Anemia  - Advanced Liver disease

## 2019-10-30 NOTE — Op Note (Addendum)
PATIENT ID:      Melanie Flores  MRN:     [id number] DOB/AGE:    10-18-1945 / 74 y.o.       OPERATIVE REPORT   DATE OF PROCEDURE:  10/30/2019      PREOPERATIVE DIAGNOSIS:   OSTEOARTHRITIS RIGHT KNEE      Estimated body mass index is 28.79 kg/m as calculated from the following:   Height as of this encounter: 5\' 5"  (1.651 m).   Weight as of this encounter: 78.5 kg.                                                       POSTOPERATIVE DIAGNOSIS:   OSTEOARTHRITIS RIGHT KNEE                                                                       PROCEDURE:  Procedure(s): TOTAL KNEE ARTHROPLASTY Using DepuyAttune RP implants #5N Femur, #5Tibia, 6 mm Attune RP bearing, 38 Patella    SURGEON: Melanie Flores  ASSISTANT:  Gary Fleet PA-C   (Present and scrubbed throughout the case, critical for assistance with exposure, retraction, instrumentation, and closure.)        ANESTHESIA: spinal, 20cc Exparel, 50cc 0.25% Marcaine EBL: min cc FLUID REPLACEMENT: unk cc crystaloid TOURNIQUET: DRAINS: None TRANEXAMIC ACID: 1gm IV, 2gm topical COMPLICATIONS:  None         INDICATIONS FOR PROCEDURE: The patient has  OSTEOARTHRITIS RIGHT KNEE, mild valgus deformities, XR shows bone on bone arthritis, lateral subluxation of tibia. Patient has failed all conservative measures including anti-inflammatory medicines, narcotics, attempts at exercise and weight loss, cortisone injections and viscosupplementation.  Risks and benefits of surgery have been discussed, questions answered.   DESCRIPTION OF PROCEDURE: The patient identified by armband, received  IV antibiotics, in the holding area at [facility]. Patient taken to the operating room, appropriate anesthetic monitors were attached, and spinal anesthesia was  induced. IV Tranexamic acid was given.Tourniquet applied high to the operative thigh. Lateral post and foot positioner applied to the table, the lower extremity was then prepped and draped in  usual sterile fashion from the toes to the tourniquet. Time-out procedure was performed. [name] [facility] PAC, was present and scrubbed throughout the case, critical for assistance with, positioning, exposure, retraction, instrumentation, and closure.The skin and subcutaneous tissue along the incision was injected with 20 cc of a mixture of Exparel and Marcaine solution, using a 20-gauge by 1-1/2 inch needle. We began the operation, with the knee flexed 130 degrees, by making the anterior midline incision starting at handbreadth above the patella going over the patella 1 cm medial to and 4 cm distal to the tibial tubercle. Small bleeders in the skin and the subcutaneous tissue identified and cauterized. Transverse retinaculum was incised and reflected medially and a medial parapatellar arthrotomy was accomplished. the patella was everted and theprepatellar fat pad resected. The superficial medial collateral ligament was then elevated from anterior to posterior along the proximal flare of the tibia and anterior half of the menisci resected. The knee was hyperflexed exposing  bone on bone arthritis. Peripheral and notch osteophytes as well as the cruciate ligaments were then resected. We continued to work our way around posteriorly along the proximal tibia, and externally rotated the tibia subluxing it out from underneath the femur. A McHale PCL retractor was placed through the notch and a lateral Hohmann retractor placed, and we then entered the proximal tibia in line with the Depuy starter drill in line with the axis of the tibia followed by an intramedullary guide rod and 0-degree posterior slope cutting guide. The tibial cutting guide, 4 degree posterior sloped, was pinned into place allowing resection of 2 mm of bone medially and 10 mm of bone laterally. Satisfied with the tibial resection, we then entered the distal femur 2 mm anterior to the PCL origin with the intramedullary guide rod and applied the distal  femoral cutting guide set at 9 mm, with 5 degrees of valgus. This was pinned along the epicondylar axis. At this point, the distal femoral cut was accomplished without difficulty. We then sized for a #5N femoral component and pinned the guide in 3 degrees of external rotation. The chamfer cutting guide was pinned into place. The anterior, posterior, and chamfer cuts were accomplished without difficulty followed by the Attune RP box cutting guide and the box cut. We also removed posterior osteophytes from the posterior femoral condyles. The posterior capsule was injected with Exparel solution. The knee was brought into full extension. We checked our extension gap and fit a 6 mm bearing. Distracting in extension with a lamina spreader,  bleeders in the posterior capsule, Posterior medial and posterior lateral gutter were cauterized.  The transexamic acid-soaked sponge was then placed in the gap of the knee in extension. The knee was flexed 30. The posterior patella cut was accomplished with the 9.5 mm Attune cutting guide, sized for a 58mm dome, and the fixation pegs drilled.The knee was then once again hyperflexed exposing the proximal tibia. We sized for a # 5 tibial base plate, applied the smokestack and the conical reamer followed by the the Delta fin keel punch. We then hammered into place the Attune RP trial femoral component, drilled the lugs, inserted a  6 mm trial bearing, trial patellar button, and took the knee through range of motion from 0-130 degrees. Medial and lateral ligamentous stability was checked. No thumb pressure was required for patellar Tracking. The tourniquet was released 45 min. All trial components were removed, mating surfaces irrigated with pulse lavage, and dried with suction and sponges. 10 cc of the Exparel solution was applied to the cancellus bone of the patella distal femur and proximal tibia.  After waiting 30 seconds, the bony surfaces were again, dried with sponges. A double  batch of DePuy HV cement was mixed and applied to all bony metallic mating surfaces except for the posterior condyles of the femur itself. In order, we hammered into place the tibial tray and removed excess cement, the femoral component and removed excess cement. The final Attune RP bearing was inserted, and the knee brought to full extension with compression. The patellar button was clamped into place, and excess cement removed. The knee was held at 30 flexion with compression, while the cement cured. The wound was irrigated out with normal saline solution pulse lavage. The rest of the Exparel was injected into the parapatellar arthrotomy, subcutaneous tissues, and periosteal tissues. The parapatellar arthrotomy was closed with running #1 Vicryl suture. The subcutaneous tissue with 0 and 2-0 undyed Vicryl suture, and the skin  with running 3-0 SQ vicryl. An Aquacil and Ace wrap were applied. The patient was taken to recovery room without difficulty.   Melanie Flores 10/30/2019, 10:15 AM

## 2019-10-31 DIAGNOSIS — M1711 Unilateral primary osteoarthritis, right knee: Secondary | ICD-10-CM | POA: Diagnosis not present

## 2019-10-31 LAB — CBC
HCT: 35.4 % — ABNORMAL LOW (ref 36.0–46.0)
Hemoglobin: 11.7 g/dL — ABNORMAL LOW (ref 12.0–15.0)
MCH: 32.1 pg (ref 26.0–34.0)
MCHC: 33.1 g/dL (ref 30.0–36.0)
MCV: 97 fL (ref 80.0–100.0)
Platelets: 297 10*3/uL (ref 150–400)
RBC: 3.65 MIL/uL — ABNORMAL LOW (ref 3.87–5.11)
RDW: 14.5 % (ref 11.5–15.5)
WBC: 13.4 10*3/uL — ABNORMAL HIGH (ref 4.0–10.5)
nRBC: 0 % (ref 0.0–0.2)

## 2019-10-31 NOTE — Discharge Summary (Signed)
Patient ID: Melanie Flores MRN: [id number] DOB/AGE: 10-30-1945 74 y.o.  Admit date: 10/30/2019 Discharge date: 10/31/2019  Admission Diagnoses:  Principal Problem:   Primary osteoarthritis of right knee   Discharge Diagnoses:  Same  Past Medical History:  Diagnosis Date  . Arthritis   . Breast cancer ([place])   . Cancer ([place])    ovarian, breast  . Chronic bronchitis ([place])   . COPD (chronic obstructive pulmonary disease) ([place])   . Depression   . Dyspnea    exertion  . Hypertension     Surgeries: Procedure(s): Right TOTAL KNEE ARTHROPLASTY on 10/30/2019   Consultants:   Discharged Condition: Improved  Hospital Course: Melanie Flores is an 74 y.o. female who was admitted 10/30/2019 for operative treatment ofPrimary osteoarthritis of right knee. Patient has severe unremitting pain that affects sleep, daily activities, and work/hobbies. After pre-op clearance the patient was taken to the operating room on 10/30/2019 and underwent  Procedure(s): Right TOTAL KNEE ARTHROPLASTY.    Patient was given perioperative antibiotics:  Anti-infectives (From admission, onward)   Start     Dose/Rate Route Frequency Ordered Stop   10/30/19 1500  ceFAZolin (ANCEF) IVPB 2g/100 mL premix     2 g 200 mL/hr over 30 Minutes Intravenous Every 6 hours 10/30/19 1239 10/30/19 2244   10/30/19 0645  ceFAZolin (ANCEF) IVPB 2g/100 mL premix     2 g 200 mL/hr over 30 Minutes Intravenous On call to O.R. 10/30/19 [id number] 10/30/19 0912   10/30/19 0644  ceFAZolin (ANCEF) 2-4 GM/100ML-% IVPB    Note to Pharmacy: Kyra Leyland   : cabinet override      10/30/19 0644 10/30/19 0922       Patient was given sequential compression devices, early ambulation, and chemoprophylaxis to prevent DVT.  Patient benefited maximally from hospital stay and there were no complications.    Recent vital signs:  Patient Vitals for the past 24 hrs:  BP Temp Temp src Pulse Resp SpO2  10/31/19 0943 (!) 156/73 98.4 F  (36.9 C) Oral 87 18 93 %  10/31/19 0844 (!) 125/99 - - 83 - -  10/31/19 0836 - - - - - 92 %  10/31/19 0530 136/83 97.6 F (36.4 C) Oral 72 16 -  10/31/19 0142 (!) 144/88 98 F (36.7 C) Oral 69 12 97 %  10/30/19 2122 (!) 143/66 99.1 F (37.3 C) Oral 85 16 96 %  10/30/19 1808 130/70 98.4 F (36.9 C) - 83 14 93 %  10/30/19 1537 (!) 152/79 97.9 F (36.6 C) - 74 18 93 %  10/30/19 1440 (!) 167/87 97.8 F (36.6 C) Oral 70 15 100 %  10/30/19 1331 (!) 152/87 97.7 F (36.5 C) Oral 60 14 100 %  10/30/19 1234 116/71 97.6 F (36.4 C) - (!) 49 20 100 %  10/30/19 1215 125/70 97.8 F (36.6 C) - - - -  10/30/19 1145 - - - (!) 59 13 97 %  10/30/19 1130 108/71 - - (!) 59 13 100 %  10/30/19 1115 122/77 - - (!) 57 18 97 %     Recent laboratory studies:  Recent Labs    10/31/19 0416  WBC 13.4*  HGB 11.7*  HCT 35.4*  PLT 297     Discharge Medications:   Allergies as of 10/31/2019      Reactions   Levaquin [levofloxacin] Nausea And Vomiting   Morphine And Related Itching   Varenicline Other (See Comments)   Personality change "went crazy"  Medication List    STOP taking these medications   augmented betamethasone dipropionate 0.05 % cream Commonly known as: Diprolene AF   ibuprofen 800 MG tablet Commonly known as: ADVIL     TAKE these medications   acetaminophen 500 MG tablet Commonly known as: TYLENOL Take 1,000 mg by mouth every 6 (six) hours as needed for headache.   albuterol 108 (90 Base) MCG/ACT inhaler Commonly known as: VENTOLIN HFA Inhale into the lungs every 6 (six) hours as needed for wheezing or shortness of breath.   Anoro Ellipta 62.5-25 MCG/INH Aepb Generic drug: umeclidinium-vilanterol Inhale 1 puff into the lungs daily.   aspirin EC 325 MG tablet Take 1 tablet (325 mg total) by mouth 2 (two) times daily after a meal. Take x 1 month post op to decrease risk of blood clots. What changed:   when to take this  additional instructions   buPROPion  300 MG 24 hr tablet Commonly known as: WELLBUTRIN XL Take 300 mg by mouth daily.   Calcium 600 600 MG Tabs tablet Generic drug: calcium carbonate Take 600 mg by mouth daily.   celecoxib 200 MG capsule Commonly known as: CeleBREX Take 1 capsule (200 mg total) by mouth daily.   Co Q 10 100 MG Caps Take 100 mg by mouth daily.   docusate sodium 100 MG capsule Commonly known as: Colace Take 1 capsule (100 mg total) by mouth 2 (two) times daily. What changed: when to take this   hydrochlorothiazide 12.5 MG capsule Commonly known as: MICROZIDE Take 12.5 mg by mouth daily.   metoprolol tartrate 25 MG tablet Commonly known as: LOPRESSOR Take 25 mg by mouth in the morning and at bedtime.   MULTIPLE VITAMIN PO Take 1 tablet by mouth daily. 50 +   Myrbetriq 25 MG Tb24 tablet Generic drug: mirabegron ER Take 25 mg by mouth daily.   olopatadine 0.1 % ophthalmic solution Commonly known as: Patanol Place 1 drop into both eyes 2 (two) times daily.   OVER THE COUNTER MEDICATION Take 500 mg by mouth daily. Cognitive Health   OVER THE COUNTER MEDICATION Take 1 tablet by mouth daily. allerclear   Percocet 5-325 MG tablet Generic drug: oxyCODONE-acetaminophen Take 1-2 tablets by mouth every 6 (six) hours as needed.   polyethylene glycol 17 g packet Commonly known as: MIRALAX / GLYCOLAX Take 17 g by mouth daily.   PROBIOTIC DAILY PO Take 1 tablet by mouth daily. 15 Billion   rosuvastatin 5 MG tablet Commonly known as: CRESTOR Take 5 mg by mouth at bedtime.   tiZANidine 2 MG tablet Commonly known as: ZANAFLEX Take 2 mg by mouth every 8 (eight) hours as needed.   traMADol 50 MG tablet Commonly known as: ULTRAM Take 1 tablet (50 mg total) by mouth every 8 (eight) hours as needed for moderate pain. Maximum 6 tabs per day.   Vitamin D-3 125 MCG (5000 UT) Tabs Take 5,000 Units by mouth daily.            Durable Medical Equipment  (From admission, onward)          Start     Ordered   10/30/19 1240  DME Walker rolling  Once    Question:  Patient needs a walker to treat with the following condition  Answer:  Primary osteoarthritis of right knee   10/30/19 1239   10/30/19 1240  DME 3 n 1  Once     10/30/19 1239  Discharge Care Instructions  (From admission, onward)         Start     Ordered   10/31/19 0000  Weight bearing as tolerated    Question Answer Comment  Laterality left   Extremity Lower      10/31/19 1103          Diagnostic Studies: DG Chest 2 View  Result Date: 10/26/2019 CLINICAL DATA:  Preop evaluation for upcoming knee replacement EXAM: CHEST - 2 VIEW COMPARISON:  08/11/2019 FINDINGS: Cardiac shadow is within normal limits. The lungs are clear bilaterally. Postsurgical changes are noted right breast. Degenerative changes of the thoracic spine are again seen. IMPRESSION: No acute abnormality noted. Electronically Signed   By: Inez Catalina M.D.   On: 10/26/2019 23:37    Disposition: Discharge disposition: 01-Home or Self Care       Discharge Instructions    CPM   Complete by: As directed    Continuous passive motion machine (CPM):      Use the CPM from 0 to 70 for 8 hours per day.      You may increase by 5-10 per day.  You may break it up into 2 or 3 sessions per day.      Use CPM for 1-2 weeks or until you are told to stop.   Call MD / Call 911   Complete by: As directed    If you experience chest pain or shortness of breath, CALL 911 and be transported to the hospital emergency room.  If you develope a fever above 101 F, pus (white drainage) or increased drainage or redness at the wound, or calf pain, call your surgeon's office.   Constipation Prevention   Complete by: As directed    Drink plenty of fluids.  Prune juice may be helpful.  You may use a stool softener, such as Colace (over the counter) 100 mg twice a day.  Use MiraLax (over the counter) for constipation as needed.   Diet general    Complete by: As directed    Do not put a pillow under the knee. Place it under the heel.   Complete by: As directed    Increase activity slowly as tolerated   Complete by: As directed    Weight bearing as tolerated   Complete by: As directed    Laterality: left   Extremity: Lower      Follow-up Information    Dorna Leitz, MD. Schedule an appointment as soon as possible for a visit in 2 weeks.   Specialty: Orthopedic Surgery Contact information: [place] [address] [phone]            Signed: [name] 10/31/2019, 11:03 AM

## 2019-10-31 NOTE — Progress Notes (Signed)
Pt provided with d/c instructions. After discussing the pt's plan of care upon d/c home, the pt denied any further questions or concerns.  

## 2019-10-31 NOTE — Progress Notes (Addendum)
Physical Therapy Treatment Patient Details Name: Melanie Flores MRN: [id number] DOB: 28-Sep-1945 Today's Date: 10/31/2019    History of Present Illness Patient is 74 y.o. female s/p Rt TKA on 10/30/19 with PMH significant for HTN, dyspnea, COPD, ovarian and breast cancer, OA.    PT Comments    Progressing with mobility. Cues for safety required throughout session (pt tends to forget to use the RW at all times). Reviewed/practiced exercises, gait training, and stair training. Issued HEP for pt to perform 2x/day until HHPT begins. All education completed. Okay to d/c from PT standpoint.    Follow Up Recommendations  Follow surgeon's recommendation for DC plan and follow-up therapies     Equipment Recommendations  None recommended by PT    Recommendations for Other Services       Precautions / Restrictions Precautions Precautions: Fall Restrictions Weight Bearing Restrictions: No Other Position/Activity Restrictions: WBAT    Mobility  Bed Mobility Overal bed mobility: Needs Assistance Bed Mobility: Supine to Sit     Supine to sit: Supervision;HOB elevated     General bed mobility comments: for safety  Transfers Overall transfer level: Needs assistance Equipment used: Rolling walker (2 wheeled) Transfers: Sit to/from Stand Sit to Stand: Min guard         General transfer comment: close guard for safety. VCs safety, technique, hand placement.  Ambulation/Gait Ambulation/Gait assistance: Min guard Gait Distance (Feet): 140 Feet Assistive device: Rolling walker (2 wheeled) Gait Pattern/deviations: Step-through pattern;Decreased stride length     General Gait Details: close guard for safety.   Stairs Stairs: Yes Stairs assistance: Min assist Stair Management: Step to pattern;Forwards;With walker Number of Stairs: 2 General stair comments: up and over portable stairs with use of RW. vcs safety, technique, sequence. Assist to stabilize walker. Husband present  to observe and assist   Wheelchair Mobility    Modified Rankin (Stroke Patients Only)       Balance Overall balance assessment: Needs assistance         Standing balance support: Bilateral upper extremity supported Standing balance-Leahy Scale: Poor                              Cognition Arousal/Alertness: Awake/alert Behavior During Therapy: WFL for tasks assessed/performed Overall Cognitive Status: Within Functional Limits for tasks assessed                                        Exercises Total Joint Exercises Ankle Circles/Pumps: AROM;Both;15 reps Quad Sets: AROM;Both;15 reps Heel Slides: AAROM;Right;10 reps Hip ABduction/ADduction: AROM;Right;10 reps Goniometric ROM: ~5-70 degrees    General Comments        Pertinent Vitals/Pain Pain Assessment: 0-10 Pain Score: 6  Pain Location: R knee Pain Descriptors / Indicators: Sore;Aching Pain Intervention(s): Ice applied;Repositioned;Monitored during session;Patient requesting pain meds-RN notified    Home Living                      Prior Function            PT Goals (current goals can now be found in the care plan section)      Frequency    7X/week      PT Plan Current plan remains appropriate    Co-evaluation              AM-PAC PT "6 Clicks"  Mobility   Outcome Measure  Help needed turning from your back to your side while in a flat bed without using bedrails?: A Little Help needed moving from lying on your back to sitting on the side of a flat bed without using bedrails?: A Little Help needed moving to and from a bed to a chair (including a wheelchair)?: A Little Help needed standing up from a chair using your arms (e.g., wheelchair or bedside chair)?: A Little Help needed to walk in hospital room?: A Little Help needed climbing 3-5 steps with a railing? : A Little 6 Click Score: 18    End of Session Equipment Utilized During Treatment: Gait  belt Activity Tolerance: Patient tolerated treatment well Patient left: in chair;with call bell/phone within reach;with family/visitor present   PT Visit Diagnosis: Muscle weakness (generalized) (M62.81);Difficulty in walking, not elsewhere classified (R26.2)     Time: [id number] PT Time Calculation (min) (ACUTE ONLY): 29 min  Charges:  $Gait Training: 8-22 mins $Therapeutic Exercise: 8-22 mins                        Doreatha Massed, PT Acute Rehabilitation

## 2019-10-31 NOTE — Progress Notes (Signed)
Subjective: 1 Day Post-Op Procedure(s) (LRB): TOTAL KNEE ARTHROPLASTY (Right) Patient reports pain as mild.  Taking by mouth and voiding okay.  Reports that she will be ready to go home after physical therapy.  Husband at bedside.  No chest pain or shortness of breath.  Objective: Vital signs in last 24 hours: Temp:  [97.6 F (36.4 C)-99.1 F (37.3 C)] 98.4 F (36.9 C) (03/13 0943) Pulse Rate:  [49-87] 87 (03/13 0943) Resp:  [12-20] 18 (03/13 0943) BP: (108-167)/(66-99) 156/73 (03/13 0943) SpO2:  [92 %-100 %] 93 % (03/13 0943)  Intake/Output from previous day: 03/12 0701 - 03/13 0700 In: 4129.2 [P.O.:330; I.V.:3449.2; IV Piggyback:350] Out: 2650 [Urine:2500; Blood:150] Intake/Output this shift: Total I/O In: 900.8 [P.O.:240; I.V.:660.8] Out: -   Recent Labs    10/31/19 0416  HGB 11.7*   Recent Labs    10/31/19 0416  WBC 13.4*  RBC 3.65*  HCT 35.4*  PLT 297   No results for input(s): NA, K, CL, CO2, BUN, CREATININE, GLUCOSE, CALCIUM in the last 72 hours. No results for input(s): LABPT, INR in the last 72 hours. Right knee exam: Neurovascular intact Sensation intact distally Intact pulses distally Dorsiflexion/Plantar flexion intact Incision: dressing C/D/I Compartment soft   Assessment/Plan: 1 Day Post-Op Procedure(s) (LRB): TOTAL KNEE ARTHROPLASTY (Right) Plan: Aspirin 325 mg twice daily x1 month postop for DVT prophylaxis. Weight-bear as tolerated on right lower extremity. Up with therapy Discharge home with home health today after physical therapy. Follow-up with Dr. Berenice Primas in 2 weeks.    Patient's anticipated LOS is less than 2 midnights, meeting these requirements: - Lives within 1 hour of care - Has a competent adult at home to recover with post-op recover - NO history of  - Chronic pain requiring opiods  - Diabetes  - Coronary Artery Disease  - Heart failure  - Heart attack  - Stroke  - DVT/VTE  - Cardiac arrhythmia  - Respiratory  Failure/COPD  - Renal failure  - Anemia  - Advanced Liver disease       Melanie Flores 10/31/2019, 10:58 AM

## 2019-10-31 NOTE — TOC Progression Note (Signed)
Transition of Care [facility]) - Progression Note    Patient Details  Name: DEMILADE SHOWMAN MRN: [id number] Date of Birth: 1946-06-11  Transition of Care [facility]) CM/SW Contact  Joaquin Courts, RN Phone Number: 10/31/2019, 1:52 PM  Clinical Narrative:    CM spoke with patient at bedside. Patient set up with [facility] for [place]. Patient reports she has rolling walker and declines 3in1.    Expected Discharge Plan: [place] Barriers to Discharge: No Barriers Identified  Expected Discharge Plan and Services Expected Discharge Plan: [place]   Discharge Planning Services: CM Consult Post Acute Care Choice: [place] arrangements for the past 2 months: Single Family Home Expected Discharge Date: 10/31/19               DME Arranged: N/A DME Agency: NA       HH Arranged: PT HH Agency: [place] Date [place]: 10/31/19 Time [place]: [id number] Representative spoke with at [place]: [place] ([place]) Interventions    Readmission Risk Interventions No flowsheet data found.

## 2019-11-02 ENCOUNTER — Encounter: Payer: Self-pay | Admitting: *Deleted

## 2019-11-06 ENCOUNTER — Ambulatory Visit: Payer: Medicare Other | Admitting: Rehabilitative and Restorative Service Providers"

## 2019-11-11 ENCOUNTER — Other Ambulatory Visit: Payer: Self-pay

## 2019-11-11 ENCOUNTER — Ambulatory Visit (INDEPENDENT_AMBULATORY_CARE_PROVIDER_SITE_OTHER): Payer: Medicare PPO | Admitting: Rehabilitative and Restorative Service Providers"

## 2019-11-11 DIAGNOSIS — M25561 Pain in right knee: Secondary | ICD-10-CM | POA: Diagnosis not present

## 2019-11-11 DIAGNOSIS — R600 Localized edema: Secondary | ICD-10-CM

## 2019-11-11 DIAGNOSIS — R2689 Other abnormalities of gait and mobility: Secondary | ICD-10-CM

## 2019-11-11 DIAGNOSIS — M6281 Muscle weakness (generalized): Secondary | ICD-10-CM | POA: Diagnosis not present

## 2019-11-11 NOTE — Patient Instructions (Signed)
Access Code: [email or web address] URL: [email or web address] Date: 11/11/2019 Prepared by: Rudell Cobb  Exercises Supine Bridge - 2 x daily - 7 x weekly - 10 reps - 1 sets Straight Leg Raise - 2 x daily - 7 x weekly - 10 reps - 1 sets Supine Quad Set - 2 x daily - 7 x weekly - 1 sets - 10 reps Gastroc Stretch on Wall - 2 x daily - 7 x weekly - 10 reps - 1 sets Heel Toe Raises with Counter Support - 2 x daily - 7 x weekly - 1 sets - 10 reps

## 2019-11-11 NOTE — Therapy (Signed)
[place], [place], [address] Phone: [phone]   Fax:  [phone]  Physical Therapy Evaluation  Patient Details  Name: Melanie Flores MRN: [id number] Date of Birth: 07-18-1946 Referring Provider (PT): Dorna Leitz, MD   Encounter Date: 11/11/2019  PT End of Session - 11/11/19 1146    Visit Number  1    Number of Visits  12    Date for PT Re-Evaluation  12/23/19    Authorization Type  [employer] medicare    PT Start Time  1103    PT Stop Time  1150    PT Time Calculation (min)  47 min       Past Medical History:  Diagnosis Date  . Arthritis   . Breast cancer ([place])   . Cancer ([place])    ovarian, breast  . Chronic bronchitis ([place])   . COPD (chronic obstructive pulmonary disease) ([place])   . Depression   . Dyspnea    exertion  . Hypertension     Past Surgical History:  Procedure Laterality Date  . ABDOMINAL HYSTERECTOMY    . BREAST SURGERY    . CESAREAN SECTION    . CHOLECYSTECTOMY    . ELBOW LIGAMENT RECONSTRUCTION Right   . KNEE SURGERY    . SPLENECTOMY, TOTAL    . TONSILLECTOMY    . TOTAL KNEE ARTHROPLASTY Right 10/30/2019   Procedure: TOTAL KNEE ARTHROPLASTY;  Surgeon: Dorna Leitz, MD;  Location: [facility];  Service: Orthopedics;  Laterality: Right;    There were no vitals filed for this visit.   Subjective Assessment - 11/11/19 1104    Subjective  The patient is s/p R TKA on 10/30/19.  She is no longer using the walker and has transitioned to a cane she purchased yesterday.  She reported she fell Thanksgiving 06/2018 and injured the right knee.  May 2020 she had meniscus surgery without relief.    Pertinent History  HTN, depression, h/o cancer (ovarian, breast CA), h/o gall bladder removed,    Patient Stated Goals  eliminate the pain and get back to walking a mile    Currently in Pain?  Yes    Pain Score  4     Pain Location  Knee    Pain Orientation  Right    Pain Descriptors /  Indicators  Aching;Sore    Pain Type  Surgical pain    Pain Radiating Towards  into thigh    Pain Onset  1 to 4 weeks ago    Pain Frequency  Intermittent    Aggravating Factors   turning    Pain Relieving Factors  rest         [facility] PT Assessment - 11/11/19 1109      Assessment   Medical Diagnosis  R total knee arthroplasty    Referring Provider (PT)  Dorna Leitz, MD    Onset Date/Surgical Date  10/30/19    Hand Dominance  Left    Next MD Visit  11/12/19    Prior Therapy  prior therapy 4 visits at [facility] *just finished Monday      Precautions   Precautions  Fall    Precaution Comments  h/o falls      Restrictions   Weight Bearing Restrictions  Yes    RLE Weight Bearing  Weight bearing as tolerated      Balance Screen   Has the patient fallen in the past 6 months  No    Has  the patient had a decrease in activity level because of a fear of falling?   No    Is the patient reluctant to leave their home because of a fear of falling?   No      Home Environment   Living Environment  Private residence    Living Arrangements  Spouse/significant other    Type of [place] to enter    Entrance Stairs-Number of Steps  2    [place]  One level    [place] - 2 wheels;Cane - single point      Prior Function   Level of Independence  Independent      Observation/Other Assessments   Focus on Therapeutic Outcomes (FOTO)   57% limited      Observation/Other Assessments-Edema    Edema  Circumferential      Circumferential Edema   Circumferential - Right  44.5 cm    Circumferential - Left   41.5 cm      Sensation   Light Touch  Appears Intact      Posture/Postural Control   Posture Comments  --      ROM / Strength   AROM / PROM / Strength  AROM;Strength      AROM   Overall AROM Comments  resting angle in supine of -16 degrees exension    AROM Assessment Site  Knee    Right/Left Knee  Right    Right  Knee Extension  110   supine knee to chest   Right Knee Flexion  -10   when performing supine quad set     Strength   Overall Strength  Deficits    Overall Strength Comments  extends -11 degrees during long arc quad    Strength Assessment Site  Hip;Knee;Ankle    Right/Left Hip  Right;Left   >3/5 muscle strength noted through function     Flexibility   Soft Tissue Assessment /Muscle Length  yes    Hamstrings  tightness in R side for hamstring and gastroc    Quadriceps  tightness R LE      Ambulation/Gait   Ambulation/Gait  Yes    Ambulation/Gait Assistance  6: Modified independent (Device/Increase time)    Ambulation Distance (Feet)  150 Feet    Assistive device  Straight cane    Gait Pattern  Decreased stance time - right;Decreased weight shift to right    Gait velocity  1.87 ft/sec    Stairs  Yes    Stairs Assistance  6: Modified independent (Device/Increase time)    Stair Management Technique  One rail Left;Step to pattern;With cane    Number of Stairs  4                Objective measurements completed on examination: See above findings.      [facility] Adult PT Treatment/Exercise - 11/11/19 1109      Exercises   Exercises  Knee/Hip      Knee/Hip Exercises: Stretches   Gastroc Stretch  Right;Left;1 rep;20 seconds      Knee/Hip Exercises: Standing   Heel Raises  Both;10 reps    Heel Raises Limitations  and toe raises near countertop      Knee/Hip Exercises: Supine   Quad Sets  Strengthening;Right;5 reps    Bridges  Strengthening;Both;5 reps    Straight Leg Raises  Strengthening;Right;5 reps      Knee/Hip Exercises:  Sidelying   Hip ABduction  Strengthening;Right;5 reps    Hip ABduction Limitations  painful to knee  * will work on SLR supine and work towards sidelying      Modalities   Modalities  Vasopneumatic      Vasopneumatic   Number Minutes Vasopneumatic   10 minutes    Vasopnuematic Location   Knee    Vasopneumatic Pressure  Low     Vasopneumatic Temperature   34 deg             PT Education - 11/11/19 1217    Education Details  HEP    Person(s) Educated  Patient    Methods  Explanation;Demonstration;Handout    Comprehension  Verbalized understanding;Returned demonstration          PT Long Term Goals - 11/11/19 1147      PT LONG TERM GOAL #1   Title  The patient will be independent with HEP.    Time  6    Period  Weeks    Target Date  12/23/19      PT LONG TERM GOAL #2   Title  The patient will improve R knee strength demonstrating 5 degree extensor lag.    Time  6    Period  Weeks    Target Date  12/23/19      PT LONG TERM GOAL #3   Title  The patient will improve R knee AROM to -3 degrees full extension.    Time  6    Period  Weeks    Target Date  12/23/19      PT LONG TERM GOAL #4   Title  The patient will improve gait speed to > or equal to 2.8 ft/sec.    Baseline  1.87 ft/sec    Time  6    Period  Weeks    Target Date  12/23/19      PT LONG TERM GOAL #5   Title  The patient will ambulate 500 ft without device independently.    Time  6    Period  Weeks    Target Date  12/23/19      Additional Long Term Goals   Additional Long Term Goals  Yes      PT LONG TERM GOAL #6   Title  The patient will negotiate 4 steps without a handrail with reciprocal pattern.    Time  6    Period  Weeks    Target Date  12/23/19             Plan - 11/11/19 1149    Clinical Impression Statement  The patient is a 74 yo female presenting to outpatient physical therapy s/p R TKA on 10/30/19.  She presents with impairments in knee extension ROM, quad strength, LE flexibility, and gait deviations.  She is functionally limited in household gait, community gait, ADL and IADL performance. PT to address deficits to return to prior level of funciton.    Personal Factors and Comorbidities  Comorbidity 1;Comorbidity 2    Comorbidities  HTN, depression    Examination-Activity Limitations  Locomotion  Level;Squat;Stairs    Examination-Participation Restrictions  Community Activity;Driving    Stability/Clinical Decision Making  Stable/Uncomplicated    Clinical Decision Making  Low    Rehab Potential  Good    PT Frequency  2x / week    PT Duration  6 weeks    PT Treatment/Interventions  ADLs/Self Care Home Management;Gait training;Stair training;Cryotherapy;Functional mobility training;Therapeutic activities;Therapeutic exercise;Electrical  Stimulation;Balance training;Neuromuscular re-education;Moist Heat;Taping;Patient/family education;Manual techniques;Passive range of motion    PT Next Visit Plan  Check HEP, warm up on bike (1/2 circles), emphasize knee extension, R hip abductor strengthening, gait training, LE strengthening, stretching.    PT Home Exercise Plan  Access Code: [email or web address]    Consulted and Agree with Plan of Care  Patient       Patient will benefit from skilled therapeutic intervention in order to improve the following deficits and impairments:  Difficulty walking, Decreased balance, Decreased strength, Decreased range of motion, Increased fascial restricitons, Pain, Impaired flexibility  Visit Diagnosis: Acute pain of right knee  Muscle weakness (generalized)  Other abnormalities of gait and mobility  Localized edema     Problem List Patient Active Problem List   Diagnosis Date Noted  . Primary osteoarthritis of right knee 09/03/2018  . Fracture of great toe, left, closed 08/05/2018  . 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 HYPERTENSION 06/24/2008    [place], PT 11/11/2019, 12:18 PM  [facility] [place], [place], [address] Phone: [phone]   Fax:  [phone]  Name: Melanie Flores MRN: [id number] Date of Birth: 1946/01/11

## 2019-11-13 ENCOUNTER — Other Ambulatory Visit: Payer: Self-pay

## 2019-11-13 ENCOUNTER — Ambulatory Visit (INDEPENDENT_AMBULATORY_CARE_PROVIDER_SITE_OTHER): Payer: Medicare PPO | Admitting: Physical Therapy

## 2019-11-13 DIAGNOSIS — M25561 Pain in right knee: Secondary | ICD-10-CM

## 2019-11-13 DIAGNOSIS — R2689 Other abnormalities of gait and mobility: Secondary | ICD-10-CM

## 2019-11-13 DIAGNOSIS — M6281 Muscle weakness (generalized): Secondary | ICD-10-CM

## 2019-11-13 DIAGNOSIS — R600 Localized edema: Secondary | ICD-10-CM | POA: Diagnosis not present

## 2019-11-13 NOTE — Therapy (Signed)
[place], [place], [address] Phone: [phone]   Fax:  [phone]  Physical Therapy Treatment  Patient Details  Name: Melanie Flores MRN: [id number] Date of Birth: 19-Nov-1945 Referring Provider (PT): Dorna Leitz, MD   Encounter Date: 11/13/2019  PT End of Session - 11/13/19 1357    Visit Number  2    Number of Visits  12    Date for PT Re-Evaluation  12/23/19    Authorization Type  [employer] medicare    PT Start Time  1319    PT Stop Time  1404    PT Time Calculation (min)  45 min       Past Medical History:  Diagnosis Date  . Arthritis   . Breast cancer ([place])   . Cancer ([place])    ovarian, breast  . Chronic bronchitis ([place])   . COPD (chronic obstructive pulmonary disease) ([place])   . Depression   . Dyspnea    exertion  . Hypertension     Past Surgical History:  Procedure Laterality Date  . ABDOMINAL HYSTERECTOMY    . BREAST SURGERY    . CESAREAN SECTION    . CHOLECYSTECTOMY    . ELBOW LIGAMENT RECONSTRUCTION Right   . KNEE SURGERY    . SPLENECTOMY, TOTAL    . TONSILLECTOMY    . TOTAL KNEE ARTHROPLASTY Right 10/30/2019   Procedure: TOTAL KNEE ARTHROPLASTY;  Surgeon: Dorna Leitz, MD;  [facility];  Service: Orthopedics;  Laterality: Right;    There were no vitals filed for this visit.  Subjective Assessment - 11/13/19 1324    Subjective  Pt reports she didn't sleep well last night; can't get comfortable.  Otherwise no new changes.    Patient Stated Goals  eliminate the pain and get back to walking a mile    Currently in Pain?  Yes    Pain Score  5     Pain Location  Knee    Pain Orientation  Right    Pain Descriptors / Indicators  Aching;Sore         [facility] PT Assessment - 11/13/19 0001      Assessment   Medical Diagnosis  R total knee arthroplasty    Referring Provider (PT)  Dorna Leitz, MD    Onset Date/Surgical Date  10/30/19    Hand Dominance  Left    Next  MD Visit  12/17/19    Prior Therapy  prior therapy 4 visits at [facility] *just finished Monday       [facility] Adult PT Treatment/Exercise - 11/13/19 0001      Self-Care   Self-Care  Other Self-Care Comments    Other Self-Care Comments   Pt educated on self massage with rolling pin to hamstring, quad and calf to decrease tightness; pt returned demo with cues.       Knee/Hip Exercises: Stretches   Press photographer  Both;3 reps;20 seconds      Knee/Hip Exercises: Aerobic   Nustep  L3-4: 5 min      Knee/Hip Exercises: Standing   Lateral Step Up  Right;1 set;10 reps;Hand Hold: 2;Step Height: 4"    Forward Step Up  Right;1 set;10 reps;Step Height: 4";Hand Hold: 2    Step Down  Left;1 set;10 reps;Step Height: 4";Hand Hold: 2      Knee/Hip Exercises: Supine   Quad Sets  Strengthening;Right;1 set;5 reps   5 sec hold.    Bridges  Strengthening;1 set;10 reps  Straight Leg Raises  Strengthening;Right;1 set;5 reps      Vasopneumatic   Number Minutes Vasopneumatic   10 minutes    Vasopnuematic Location   Knee    Vasopneumatic Pressure  Medium    Vasopneumatic Temperature   34 deg                  PT Long Term Goals - 11/11/19 1147      PT LONG TERM GOAL #1   Title  The patient will be independent with HEP.    Time  6    Period  Weeks    Target Date  12/23/19      PT LONG TERM GOAL #2   Title  The patient will improve R knee strength demonstrating 5 degree extensor lag.    Time  6    Period  Weeks    Target Date  12/23/19      PT LONG TERM GOAL #3   Title  The patient will improve R knee AROM to -3 degrees full extension.    Time  6    Period  Weeks    Target Date  12/23/19      PT LONG TERM GOAL #4   Title  The patient will improve gait speed to > or equal to 2.8 ft/sec.    Baseline  1.87 ft/sec    Time  6    Period  Weeks    Target Date  12/23/19      PT LONG TERM GOAL #5   Title  The patient will ambulate 500 ft without device independently.    Time  6     Period  Weeks    Target Date  12/23/19      Additional Long Term Goals   Additional Long Term Goals  Yes      PT LONG TERM GOAL #6   Title  The patient will negotiate 4 steps without a handrail with reciprocal pattern.    Time  6    Period  Weeks    Target Date  12/23/19            Plan - 11/13/19 1645    Clinical Impression Statement  Pt tolerated all exercises well, without increase in pain.  She reported reduction in soreness in leg after trial of self massage with roller stick; added to HEP.  Progressing well towards goals.    Personal Factors and Comorbidities  Comorbidity 1;Comorbidity 2    Comorbidities  HTN, depression    Examination-Activity Limitations  Locomotion Level;Squat;Stairs    Examination-Participation Restrictions  Community Activity;Driving    Stability/Clinical Decision Making  Stable/Uncomplicated    Rehab Potential  Good    PT Frequency  2x / week    PT Duration  6 weeks    PT Treatment/Interventions  ADLs/Self Care Home Management;Gait training;Stair training;Cryotherapy;Functional mobility training;Therapeutic activities;Therapeutic exercise;Electrical Stimulation;Balance training;Neuromuscular re-education;Moist Heat;Taping;Patient/family education;Manual techniques;Passive range of motion    PT Next Visit Plan  Check HEP, warm up on bike, emphasize knee extension, R hip abductor strengthening, gait training, LE strengthening, stretching.    PT Home Exercise Plan  Access Code: [email or web address]    Consulted and Agree with Plan of Care  Patient       Patient will benefit from skilled therapeutic intervention in order to improve the following deficits and impairments:  Difficulty walking, Decreased balance, Decreased strength, Decreased range of motion, Increased fascial restricitons, Pain, Impaired flexibility  Visit Diagnosis: Acute pain of right knee  Muscle weakness (generalized)  Other abnormalities of gait and mobility  Localized  edema     Problem List Patient Active Problem List   Diagnosis Date Noted  . Primary osteoarthritis of right knee 09/03/2018  . Fracture of great toe, left, closed 08/05/2018  . 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 HYPERTENSION 06/24/2008   Kerin Perna, PTA 11/13/19 4:46 PM  [place], [place], [address] Phone: [phone]   Fax:  [phone]  Name: Melanie Flores MRN: [id number] Date of Birth: 17-Jun-1946

## 2019-11-19 ENCOUNTER — Encounter: Payer: Medicare PPO | Admitting: Rehabilitative and Restorative Service Providers"

## 2019-11-23 ENCOUNTER — Ambulatory Visit (INDEPENDENT_AMBULATORY_CARE_PROVIDER_SITE_OTHER): Payer: Medicare PPO | Admitting: Physical Therapy

## 2019-11-23 ENCOUNTER — Encounter: Payer: Self-pay | Admitting: Physical Therapy

## 2019-11-23 ENCOUNTER — Other Ambulatory Visit: Payer: Self-pay

## 2019-11-23 DIAGNOSIS — M6281 Muscle weakness (generalized): Secondary | ICD-10-CM | POA: Diagnosis not present

## 2019-11-23 DIAGNOSIS — M25561 Pain in right knee: Secondary | ICD-10-CM | POA: Diagnosis not present

## 2019-11-23 DIAGNOSIS — R2689 Other abnormalities of gait and mobility: Secondary | ICD-10-CM

## 2019-11-23 DIAGNOSIS — R600 Localized edema: Secondary | ICD-10-CM

## 2019-11-23 NOTE — Patient Instructions (Signed)
Access Code: [email or web address]: [email or web address]: 04/05/2021Prepared by: [place]  Supine Bridge - 2 x daily - 7 x weekly - 10 reps - 1 sets  Straight Leg Raise - 2 x daily - 7 x weekly - 10 reps - 1 sets  Supine Quad Set - 2 x daily - 7 x weekly - 1 sets - 10 reps  Gastroc Stretch on Wall - 2 x daily - 7 x weekly - 10 reps - 1 sets  Tandem Stance - 1 x daily - 7 x weekly - 1 sets - 2 reps - 30 hold  Single Leg Stance with Support - 1 x daily - 7 x weekly - 1 sets - 2 reps - 15 hold  Prone Knee Extension Hang - 1 x daily - 7 x weekly - 1 sets - 3 reps - 20-30 seconds hold  Prone Knee Flexion - 1 x daily - 7 x weekly - 3 sets - 5-10 reps

## 2019-11-23 NOTE — Therapy (Signed)
[place], [place], [address] Phone: [phone]   Fax:  [phone]  Physical Therapy Treatment  Patient Details  Name: Melanie Flores MRN: [id number] Date of Birth: April 24, 1946 Referring Provider (PT): Dorna Leitz, MD   Encounter Date: 11/23/2019  PT End of Session - 11/23/19 1151    Visit Number  3    Number of Visits  12    Date for PT Re-Evaluation  12/23/19    Authorization Type  [employer] medicare    PT Start Time  1146    PT Stop Time  9622    PT Time Calculation (min)  48 min    Activity Tolerance  Patient tolerated treatment well;No increased pain    Behavior During Therapy  WFL for tasks assessed/performed       Past Medical History:  Diagnosis Date  . Arthritis   . Breast cancer ([place])   . Cancer ([place])    ovarian, breast  . Chronic bronchitis ([place])   . COPD (chronic obstructive pulmonary disease) ([place])   . Depression   . Dyspnea    exertion  . Hypertension     Past Surgical History:  Procedure Laterality Date  . ABDOMINAL HYSTERECTOMY    . BREAST SURGERY    . CESAREAN SECTION    . CHOLECYSTECTOMY    . ELBOW LIGAMENT RECONSTRUCTION Right   . KNEE SURGERY    . SPLENECTOMY, TOTAL    . TONSILLECTOMY    . TOTAL KNEE ARTHROPLASTY Right 10/30/2019   Procedure: TOTAL KNEE ARTHROPLASTY;  Surgeon: Dorna Leitz, MD;  [facility];  Service: Orthopedics;  Laterality: Right;    There were no vitals filed for this visit.  Subjective Assessment - 11/23/19 1153    Subjective  Pt reports she had a stomach bug last week, so she missed a couple days of exercises.  Her Rt knee is sore since she has been working on getting it straight.    Pertinent History  HTN, depression, h/o cancer (ovarian, breast CA), h/o gall bladder removed,    Patient Stated Goals  eliminate the pain and get back to walking a mile    Currently in Pain?  Yes    Pain Score  5     Pain Location  Knee    Pain  Orientation  Right    Pain Descriptors / Indicators  Sore    Aggravating Factors   prolonged standing    Pain Relieving Factors  rest.         [facility] PT Assessment - 11/23/19 0001      Assessment   Medical Diagnosis  R total knee arthroplasty    Referring Provider (PT)  Dorna Leitz, MD    Onset Date/Surgical Date  10/30/19    Hand Dominance  Left    Next MD Visit  12/17/19    Prior Therapy  prior therapy 4 visits at [facility]      AROM   AROM Assessment Site  Knee    Right/Left Knee  Right    Right Knee Extension  -5   supine   Right Knee Flexion  128        [facility] Adult PT Treatment/Exercise - 11/23/19 0001      Knee/Hip Exercises: Stretches   Passive Hamstring Stretch  Right;3 reps;20 seconds   supine with strap   Gastroc Stretch  Right;Left;2 reps;20 seconds   heels off of step     Knee/Hip Exercises: Aerobic  Recumbent Bike  L1: 5.5 min (full revolutions)      Knee/Hip Exercises: Standing   Heel Raises  Both;1 set;10 reps   heels off of step   Lateral Step Up  Right;1 set;10 reps;Hand Hold: 2;Step Height: 6"    Step Down  Left;1 set;10 reps;Hand Hold: 2;Step Height: 4"   retro step up with RLE   SLS  Rt SLS without UE support x 5 sec each leg    Other Standing Knee Exercises  tandem stance without UE support x 2 reps of 20 sec each leg forward      Knee/Hip Exercises: Supine   Quad Sets  Strengthening;Right;1 set;5 reps   5 sec    Heel Slides  Right;1 set;5 reps      Knee/Hip Exercises: Prone   Hamstring Curl  2 sets;5 reps   in between prone hang   Prone Knee Hang  --   20 sec x 2 reps     Vasopneumatic   Number Minutes Vasopneumatic   10 minutes    Vasopnuematic Location   Knee   Rt   Vasopneumatic Pressure  Medium    Vasopneumatic Temperature   34 deg             PT Education - 11/23/19 1223    Education Details  HEP    Person(s) Educated  Patient    Methods  Explanation;Demonstration;Verbal cues;Handout;Tactile cues    Comprehension   Verbalized understanding;Returned demonstration          PT Long Term Goals - 11/23/19 1227      PT LONG TERM GOAL #1   Title  The patient will be independent with HEP.    Time  6    Period  Weeks    Status  On-going      PT LONG TERM GOAL #2   Title  The patient will improve R knee strength demonstrating 5 degree extensor lag.    Time  6    Period  Weeks    Status  On-going      PT LONG TERM GOAL #3   Title  The patient will improve R knee AROM to -3 degrees full extension.    Time  6    Period  Weeks    Status  On-going      PT LONG TERM GOAL #4   Title  The patient will improve gait speed to > or equal to 2.8 ft/sec.    Baseline  1.87 ft/sec    Time  6    Period  Weeks      PT LONG TERM GOAL #5   Title  The patient will ambulate 500 ft without device independently.    Baseline  per pt report;  community ambulating without device.    Time  6    Period  Weeks    Status  Achieved      PT LONG TERM GOAL #6   Title  The patient will negotiate 4 steps without a handrail with reciprocal pattern.    Time  6    Period  Weeks    Status  On-going            Plan - 11/23/19 1229    Clinical Impression Statement  Pt now ambulating community distances without device; has met LTG#5.  Pt unable to tolerate descending 6" step with LLE; tolerated 4" step with BUE support well.  Pt will benefit from additional work on balance exercises. Pt is  progressing well towards all LTGs.    Rehab Potential  Good    PT Frequency  2x / week    PT Duration  6 weeks    PT Treatment/Interventions  ADLs/Self Care Home Management;Gait training;Stair training;Cryotherapy;Functional mobility training;Therapeutic activities;Therapeutic exercise;Electrical Stimulation;Balance training;Neuromuscular re-education;Moist Heat;Taping;Patient/family education;Manual techniques;Passive range of motion    PT Next Visit Plan  warm up on bike, emphasize knee extension, R hip abductor strengthening, gait  training, LE strengthening, stretching.    PT Home Exercise Plan  Access Code: [email or web address]       Patient will benefit from skilled therapeutic intervention in order to improve the following deficits and impairments:  Difficulty walking, Decreased balance, Decreased strength, Decreased range of motion, Increased fascial restricitons, Pain, Impaired flexibility  Visit Diagnosis: Acute pain of right knee  Muscle weakness (generalized)  Other abnormalities of gait and mobility  Localized edema     Problem List Patient Active Problem List   Diagnosis Date Noted  . Primary osteoarthritis of right knee 09/03/2018  . Fracture of great toe, left, closed 08/05/2018  . 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 HYPERTENSION 06/24/2008   Kerin Perna, PTA 11/23/19 5:04 PM  [place], [place], [address] Phone: [phone]   Fax:  [phone]  Name: Melanie Flores MRN: [id number] Date of Birth: 1945-11-10

## 2019-11-25 ENCOUNTER — Other Ambulatory Visit: Payer: Self-pay

## 2019-11-25 ENCOUNTER — Ambulatory Visit (INDEPENDENT_AMBULATORY_CARE_PROVIDER_SITE_OTHER): Payer: Medicare PPO | Admitting: Physical Therapy

## 2019-11-25 DIAGNOSIS — R600 Localized edema: Secondary | ICD-10-CM | POA: Diagnosis not present

## 2019-11-25 DIAGNOSIS — M6281 Muscle weakness (generalized): Secondary | ICD-10-CM

## 2019-11-25 DIAGNOSIS — M25561 Pain in right knee: Secondary | ICD-10-CM

## 2019-11-25 DIAGNOSIS — R2689 Other abnormalities of gait and mobility: Secondary | ICD-10-CM

## 2019-11-25 NOTE — Therapy (Signed)
[facility] [place], [place], [address] Phone: [phone]   Fax:  [phone]  Physical Therapy Treatment  Patient Details  Name: Melanie Flores MRN: [id number] Date of Birth: 07-Feb-1946 Referring Provider (PT): Dorna Leitz, MD   Encounter Date: 11/25/2019  PT End of Session - 11/25/19 1105    Visit Number  4    Number of Visits  12    Date for PT Re-Evaluation  12/23/19    Authorization Type  [employer]are    PT Start Time  1104    PT Stop Time  1150    PT Time Calculation (min)  46 min    Activity Tolerance  Patient tolerated treatment well;No increased pain    Behavior During Therapy  WFL for tasks assessed/performed       Past Medical History:  Diagnosis Date  . Arthritis   . Breast cancer ([place])   . Cancer ([place])    ovarian, breast  . Chronic bronchitis ([place])   . COPD (chronic obstructive pulmonary disease) ([place])   . Depression   . Dyspnea    exertion  . Hypertension     Past Surgical History:  Procedure Laterality Date  . ABDOMINAL HYSTERECTOMY    . BREAST SURGERY    . CESAREAN SECTION    . CHOLECYSTECTOMY    . ELBOW LIGAMENT RECONSTRUCTION Right   . KNEE SURGERY    . SPLENECTOMY, TOTAL    . TONSILLECTOMY    . TOTAL KNEE ARTHROPLASTY Right 10/30/2019   Procedure: TOTAL KNEE ARTHROPLASTY;  Surgeon: Dorna Leitz, MD;  Location: [facility];  Service: Orthopedics;  Laterality: Right;    There were no vitals filed for this visit.  Subjective Assessment - 11/25/19 1105    Subjective  Pt reports tightness in R knee and stiffness when trying to get into full R knee extension. She reports improved function with R knee and reports she walked in the grocery store with mininmal assistance from cart.    Pain Score  4     Pain Location  Knee    Pain Orientation  Posterior;Lateral    Pain Descriptors / Indicators  Dull       [facility] Adult PT Treatment/Exercise - 11/25/19 0001      Self-Care    Self-Care  Scar Mobilizations    Scar Mobilizations  Pt educated on scar mobilization: demo provided: pt gave verbal understanding      Knee/Hip Exercises: Stretches   Press photographer  Both;2 reps;30 seconds      Knee/Hip Exercises: Aerobic   Recumbent Bike  L1: 5 min     Other Aerobic  laps around gym in between exercises to decrease stiffness.       Knee/Hip Exercises: Standing   Step Down  Left;2 sets;10 reps;Step Height: 4";Step Height: 6";Hand Hold: 2    Stairs  reciprocal pattern on 6" x 6 steps - with/without UE support    SLS  Rt/ Lt SLS x 3 reps (intermittent UE support with RLE)  up to 20 sec.     Other Standing Knee Exercises  tandem stance on blue pads x 30 sec with horiz head turn, each foot forward.       Knee/Hip Exercises: Prone   Hamstring Curl  2 sets;5 reps   in between prone hang   Prone Knee Hang  1 minute   2 reps     Vasopneumatic   Number Minutes Vasopneumatic   10 minutes  Vasopnuematic Location   Knee   Rt   Vasopneumatic Pressure  Medium    Vasopneumatic Temperature   34 deg      Manual Therapy   Manual Therapy  Soft tissue mobilization    Manual therapy comments  completed with pt in prone during knee hang    Soft tissue mobilization  STM to Rt lateral hamstring and calf to decrease fascial restrictions                   PT Long Term Goals - 11/23/19 1227      PT LONG TERM GOAL #1   Title  The patient will be independent with HEP.    Time  6    Period  Weeks    Status  On-going      PT LONG TERM GOAL #2   Title  The patient will improve R knee strength demonstrating 5 degree extensor lag.    Time  6    Period  Weeks    Status  On-going      PT LONG TERM GOAL #3   Title  The patient will improve R knee AROM to -3 degrees full extension.    Time  6    Period  Weeks    Status  On-going      PT LONG TERM GOAL #4   Title  The patient will improve gait speed to > or equal to 2.8 ft/sec.    Baseline  1.87 ft/sec    Time   6    Period  Weeks      PT LONG TERM GOAL #5   Title  The patient will ambulate 500 ft without device independently.    Baseline  per pt report;  community ambulating without device.    Time  6    Period  Weeks    Status  Achieved      PT LONG TERM GOAL #6   Title  The patient will negotiate 4 steps without a handrail with reciprocal pattern.    Time  6    Period  Weeks    Status  On-going              Patient will benefit from skilled therapeutic intervention in order to improve the following deficits and impairments:     Visit Diagnosis: Acute pain of right knee  Muscle weakness (generalized)  Other abnormalities of gait and mobility  Localized edema     Problem List Patient Active Problem List   Diagnosis Date Noted  . Primary osteoarthritis of right knee 09/03/2018  . Fracture of great toe, left, closed 08/05/2018  . 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 HYPERTENSION 06/24/2008   Kerin Perna, PTA 11/25/19 1:58 PM' [facility] [place], [place], [address] Phone: [phone]   Fax:  [phone]  Name: Melanie Flores MRN: [id number] Date of Birth: 01/16/1946

## 2019-11-30 ENCOUNTER — Encounter: Payer: Medicare PPO | Admitting: Physical Therapy

## 2019-12-02 ENCOUNTER — Other Ambulatory Visit: Payer: Self-pay

## 2019-12-02 ENCOUNTER — Ambulatory Visit (INDEPENDENT_AMBULATORY_CARE_PROVIDER_SITE_OTHER): Payer: Medicare PPO | Admitting: Physical Therapy

## 2019-12-02 ENCOUNTER — Encounter: Payer: Self-pay | Admitting: Physical Therapy

## 2019-12-02 DIAGNOSIS — R600 Localized edema: Secondary | ICD-10-CM

## 2019-12-02 DIAGNOSIS — M25561 Pain in right knee: Secondary | ICD-10-CM | POA: Diagnosis not present

## 2019-12-02 DIAGNOSIS — R2689 Other abnormalities of gait and mobility: Secondary | ICD-10-CM | POA: Diagnosis not present

## 2019-12-02 DIAGNOSIS — M6281 Muscle weakness (generalized): Secondary | ICD-10-CM | POA: Diagnosis not present

## 2019-12-02 NOTE — Therapy (Addendum)
[facility] [place], [place], [address] Phone: [phone]   Fax:  [phone]  Physical Therapy Treatment  Patient Details  Name: Melanie Flores MRN: [id number] Date of Birth: [birth year] Referring Provider (PT): Dorna Leitz, MD   Encounter Date: 12/02/2019  PT End of Session - 12/02/19 1217    Visit Number  5    Number of Visits  12    Date for PT Re-Evaluation  12/23/19    Authorization Type  [employer] medicare    PT Start Time  1100    PT Stop Time  1143    PT Time Calculation (min)  43 min    Activity Tolerance  Patient tolerated treatment well;Patient limited by fatigue;No increased pain;Other (comment)   Limited by upset stomach due to new medication   Behavior During Therapy  [facility] for tasks assessed/performed       Past Medical History:  Diagnosis Date  . Arthritis   . Breast cancer ([place])   . Cancer ([place])    ovarian, breast  . Chronic bronchitis ([place])   . COPD (chronic obstructive pulmonary disease) ([place])   . Depression   . Dyspnea    exertion  . Hypertension     Past Surgical History:  Procedure Laterality Date  . ABDOMINAL HYSTERECTOMY    . BREAST SURGERY    . CESAREAN SECTION    . CHOLECYSTECTOMY    . ELBOW LIGAMENT RECONSTRUCTION Right   . KNEE SURGERY    . SPLENECTOMY, TOTAL    . TONSILLECTOMY    . TOTAL KNEE ARTHROPLASTY Right 10/30/2019   Procedure: TOTAL KNEE ARTHROPLASTY;  Surgeon: Dorna Leitz, MD;  Location: [facility];  Service: Orthopedics;  Laterality: Right;    There were no vitals filed for this visit.  Subjective Assessment - 12/02/19 1106    Subjective  Pt reported that on Saturday her Rt knee was more swollen than usual and Sunday evening she was having extreme spasms in her R LE down in her calf. She went to ED Sunday night due to severe increase of Rt knee pain. 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 She is cleared to return to therapy. She states that she has been instructed to don stockings per doctors orders but forgot today. She has complaints of stomach upset due to pain medication.    Pertinent History  HTN, depression, h/o cancer (ovarian, breast CA), h/o gall bladder removed,    Patient Stated Goals  eliminate the pain and get back to walking a mile    Currently in Pain?  Yes    Pain Score  0-No pain    Pain Location  Umbilicus    Pain Descriptors / Indicators  --   Upset     [facility] Adult PT Treatment/Exercise - 12/02/19 0001      Knee/Hip Exercises: Stretches   Gastroc Stretch  Both;2 reps;20 seconds      Knee/Hip Exercises: Aerobic   Recumbent Bike  L1: 4 min       Knee/Hip Exercises: Standing   Lateral Step Up  Right;1 set;Hand Hold: 2;Step Height: 6";5 reps    Forward Step Up  Right;1 set;Hand Hold: 2;Step Height: 6";5 reps    Step Down  Left;2 sets;Step Height: 6";Hand Hold: 2;10 reps   retrostep with Rt leg     Knee/Hip Exercises: Seated   Long Arc Quad  2 sets;Strengthening;Right;10 reps;Weights  Long Arc Quad Weight  4 lbs.    Sit to Sand  1 set;10 reps;without UE support  (Pended)    Staggered stance; Rt foot back     Knee/Hip Exercises: Supine   Straight Leg Raises  Strengthening;Right;1 set;10 reps      Knee/Hip Exercises: Sidelying   Hip ABduction  Strengthening;Right;1 set;10 reps      Vasopneumatic   Number Minutes Vasopneumatic   10 minutes    Vasopnuematic Location   Knee   Rt   Vasopneumatic Pressure  Medium    Vasopneumatic Temperature   34 deg       PT Long Term Goals - 12/02/19 1213      PT LONG TERM GOAL #1   Title  The patient will be independent with HEP.    Time  6    Period  Weeks    Status  On-going      PT LONG TERM GOAL #2   Title  The patient will improve R knee strength demonstrating 5 degree extensor lag.    Time  6    Period  Weeks    Status  On-going      PT LONG TERM GOAL #3   Title  The patient will  improve R knee AROM to -3 degrees full extension.    Time  6    Period  Weeks    Status  On-going      PT LONG TERM GOAL #4   Title  The patient will improve gait speed to > or equal to 2.8 ft/sec.    Baseline  1.87 ft/sec    Time  6    Period  Weeks      PT LONG TERM GOAL #5   Title  The patient will ambulate 500 ft without device independently.    Baseline  per pt report;  community ambulating without device.    Time  6    Period  Weeks    Status  Achieved      PT LONG TERM GOAL #6   Title  The patient will negotiate 4 steps without a handrail with reciprocal pattern.    Time  6    Period  Weeks    Status  On-going        Plan - 12/02/19 1209    Clinical Impression Statement  Pt tolerated treatment well; slower pace needed to improve pt comfort due to upset stomach and fatigue. Pt is progressing towards all goals.    Personal Factors and Comorbidities  Comorbidity 1;Comorbidity 2    Comorbidities  HTN, depression    Examination-Activity Limitations  Locomotion Level;Squat;Stairs    Examination-Participation Restrictions  Community Activity;Driving    Stability/Clinical Decision Making  Stable/Uncomplicated    Rehab Potential  Good    PT Frequency  2x / week    PT Duration  6 weeks    PT Treatment/Interventions  ADLs/Self Care Home Management;Gait training;Stair training;Cryotherapy;Functional mobility training;Therapeutic activities;Therapeutic exercise;Electrical Stimulation;Balance training;Neuromuscular re-education;Moist Heat;Taping;Patient/family education;Manual techniques;Passive range of motion    PT Next Visit Plan  warm up on bike, emphasize knee extension, R hip abductor strengthening, gait training, LE strengthening, stretching.    PT Home Exercise Plan  Access Code: [email or web address]    Consulted and Agree with Plan of Care  Patient       Patient will benefit from skilled therapeutic intervention in order to improve the following deficits and impairments:  Difficulty  walking, Decreased balance, Decreased strength, Decreased range of motion,  Increased fascial restricitons, Pain, Impaired flexibility  Visit Diagnosis: Acute pain of right knee  Muscle weakness (generalized)  Other abnormalities of gait and mobility  Localized edema     Problem List Patient Active Problem List   Diagnosis Date Noted  . Primary osteoarthritis of right knee 09/03/2018  . Fracture of great toe, left, closed 08/05/2018  . 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 HYPERTENSION 06/24/2008    Ronaldo Miyamoto, SPTA 12/02/19 12:24 PM  Kerin Perna, PTA 12/02/19 12:54 PM   [place], [place], [address] Phone: [phone]   Fax:  [phone]  Name: Melanie Flores MRN: [id number] Date of Birth: [birth year]

## 2019-12-07 ENCOUNTER — Ambulatory Visit (INDEPENDENT_AMBULATORY_CARE_PROVIDER_SITE_OTHER): Payer: Medicare PPO | Admitting: Rehabilitative and Restorative Service Providers"

## 2019-12-07 ENCOUNTER — Encounter: Payer: Self-pay | Admitting: Rehabilitative and Restorative Service Providers"

## 2019-12-07 ENCOUNTER — Other Ambulatory Visit: Payer: Self-pay

## 2019-12-07 DIAGNOSIS — M25561 Pain in right knee: Secondary | ICD-10-CM

## 2019-12-07 DIAGNOSIS — R2689 Other abnormalities of gait and mobility: Secondary | ICD-10-CM | POA: Diagnosis not present

## 2019-12-07 DIAGNOSIS — M6281 Muscle weakness (generalized): Secondary | ICD-10-CM | POA: Diagnosis not present

## 2019-12-07 DIAGNOSIS — R600 Localized edema: Secondary | ICD-10-CM | POA: Diagnosis not present

## 2019-12-07 NOTE — Patient Instructions (Signed)
Access Code: [email or web address] URL: [email or web address] Date: 12/07/2019 Prepared by: Rudell Cobb  Exercises Supine Bridge - 2 x daily - 7 x weekly - 10 reps - 1 sets Active Straight Leg Raise with Quad Set - 2 x daily - 7 x weekly - 1 sets - 10 reps Gastroc Stretch on Wall - 2 x daily - 7 x weekly - 10 reps - 1 sets Tandem Stance - 1 x daily - 7 x weekly - 1 sets - 2 reps - 30 hold Single Leg Stance with Support - 1 x daily - 7 x weekly - 1 sets - 2 reps - 15 hold Prone Knee Extension Hang - 1 x daily - 7 x weekly - 1 sets - 3 reps - 20-30 seconds hold Prone Knee Flexion - 1 x daily - 7 x weekly - 3 sets - 5-10 reps

## 2019-12-07 NOTE — Therapy (Addendum)
[place], [place], [address] Phone: [phone]   Fax:  [phone]  Physical Therapy Treatment and On Hold Note/ Discharge Summary  Patient Details  Name: Melanie Flores MRN: [id number] Date of Birth: [birth year] Referring Provider (PT): Dorna Leitz, MD   Encounter Date: 12/07/2019  PT End of Session - 12/07/19 1110    Visit Number  6    Number of Visits  12    Date for PT Re-Evaluation  12/23/19    Authorization Type  [employer] medicare    PT Start Time  1106    PT Stop Time  1146    PT Time Calculation (min)  40 min    Activity Tolerance  Patient tolerated treatment well;No increased pain    Behavior During Therapy  WFL for tasks assessed/performed       Past Medical History:  Diagnosis Date  . Arthritis   . Breast cancer ([place])   . Cancer ([place])    ovarian, breast  . Chronic bronchitis ([place])   . COPD (chronic obstructive pulmonary disease) ([place])   . Depression   . Dyspnea    exertion  . Hypertension     Past Surgical History:  Procedure Laterality Date  . ABDOMINAL HYSTERECTOMY    . BREAST SURGERY    . CESAREAN SECTION    . CHOLECYSTECTOMY    . ELBOW LIGAMENT RECONSTRUCTION Right   . KNEE SURGERY    . SPLENECTOMY, TOTAL    . TONSILLECTOMY    . TOTAL KNEE ARTHROPLASTY Right 10/30/2019   Procedure: TOTAL KNEE ARTHROPLASTY;  Surgeon: Dorna Leitz, MD;  Location: [facility];  Service: Orthopedics;  Laterality: Right;    There were no vitals filed for this visit.  Subjective Assessment - 12/07/19 1109    Subjective  The patient reports she has been working on her exercises at home.  She is not using a cane or walker.  Stairs are still challenging.    Pertinent History  HTN, depression, h/o cancer (ovarian, breast CA), h/o gall bladder removed,    Patient Stated Goals  eliminate the pain and get back to walking a mile    Currently in Pain?  No/denies         [facility] PT Assessment -  12/07/19 1300      Assessment   Medical Diagnosis  R total knee arthroplasty    Referring Provider (PT)  Dorna Leitz, MD    Onset Date/Surgical Date  10/30/19      Observation/Other Assessments   Focus on Therapeutic Outcomes (FOTO)   57% limited- at eval (37% limited today)      AROM   AROM Assessment Site  Knee    Right/Left Knee  Right    Right Knee Extension  -2    Right Knee Flexion  122                   [facility] Adult PT Treatment/Exercise - 12/07/19 1113      Self-Care   Self-Care  Other Self-Care Comments    Other Self-Care Comments   discussed continued walking for gaining strength and mobility and focusing on continuation of HEP.      Therapeutic Activites    Therapeutic Activities  Other Therapeutic Activities    Other Therapeutic Activities  floor<>stand activities with UE support through mat surface (used foam to kneel on).  Discussed this as patinet inquired if she would be able to get down on her  knees.      Exercises   Exercises  Knee/Hip      Knee/Hip Exercises: Stretches   Gastroc Stretch  Right;Left;2 reps;20 seconds    Other Knee/Hip Stretches  R knee to chest      Knee/Hip Exercises: Aerobic   Recumbent Bike  L1 x 4 minutes      Knee/Hip Exercises: Standing   Heel Raises  Both;10 reps    Heel Raises Limitations  without UE support    Lateral Step Up  Right;10 reps;Hand Hold: 1    Forward Step Up  Right;10 reps;Hand Hold: 1    SLS  right leg stance on foam x 5 seconds x 3 reps; on solid surface x 3 reps x 10 seconds dec'ing UE support.    Gait Training  The patient ambulated with emphasis on R heel strike and knee extension in mid stance phase of gait with verbal and demonstration cues    Other Standing Knee Exercises  foam standing with feet wide moving to narrow and then adding eyes closed for proprioceptive training      Knee/Hip Exercises: Supine   Quad Sets  Strengthening;Right;10 reps    Straight Leg Raises  Strengthening;Right;10  reps    Straight Leg Raises Limitations  with quad set      Knee/Hip Exercises: Sidelying   Hip ABduction  Strengthening;Right;10 reps             PT Education - 12/07/19 1145    Education Details  HEP    Person(s) Educated  Patient    Methods  Explanation;Demonstration;Handout    Comprehension  Verbalized understanding;Returned demonstration          PT Long Term Goals - 12/07/19 1137      PT LONG TERM GOAL #1   Title  The patient will be independent with HEP.    Time  6    Period  Weeks    Status  Achieved      PT LONG TERM GOAL #2   Title  The patient will improve R knee strength demonstrating 5 degree extensor lag.    Baseline  5 degrees with seated LAQ R knee    Time  6    Period  Weeks    Status  Achieved      PT LONG TERM GOAL #3   Title  The patient will improve R knee AROM to -3 degrees full extension.    Baseline  -2 degrees    Time  6    Period  Weeks    Status  Achieved      PT LONG TERM GOAL #4   Title  The patient will improve gait speed to > or equal to 2.8 ft/sec.    Baseline  3.20 ft/sec    Time  6    Period  Weeks    Status  Achieved      PT LONG TERM GOAL #5   Title  The patient will ambulate 500 ft without device independently.    Baseline  per pt report;  community ambulating without device.    Time  6    Period  Weeks    Status  Achieved      PT LONG TERM GOAL #6   Title  The patient will negotiate 4 steps without a handrail with reciprocal pattern.    Time  6    Period  Weeks    Status  Partially Met  Plan - 12/07/19 1143    Clinical Impression Statement  The patient has met 5 LTGs and partially met 1 LTG. She improved from 57% to 37% limitation per FOTO score for functional limitations.  She notes her mobiiity is greater than pre-surgery and she has no pain.  She is able to work on HEP and will call clinic to schedule if she feels she needs further session to review program.  Plan to place on hold x 3 weeks  and will d/c at that time if patient has not requested visit.    PT Frequency  2x / week    PT Duration  6 weeks    PT Treatment/Interventions  ADLs/Self Care Home Management;Gait training;Stair training;Cryotherapy;Functional mobility training;Therapeutic activities;Therapeutic exercise;Electrical Stimulation;Balance training;Neuromuscular re-education;Moist Heat;Taping;Patient/family education;Manual techniques;Passive range of motion    PT Next Visit Plan  Place on hold x 3 weeks and patient to call if needs to return.    PT Home Exercise Plan  Access Code: [email or web address]    Consulted and Agree with Plan of Care  Patient       Patient will benefit from skilled therapeutic intervention in order to improve the following deficits and impairments:     Visit Diagnosis: Acute pain of right knee  Muscle weakness (generalized)  Other abnormalities of gait and mobility  Localized edema     Problem List Patient Active Problem List   Diagnosis Date Noted  . Primary osteoarthritis of right knee 09/03/2018  . Fracture of great toe, left, closed 08/05/2018  . 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 HYPERTENSION 06/24/2008    PHYSICAL THERAPY DISCHARGE SUMMARY  Visits from Start of Care: 6   Current functional level related to goals / functional outcomes: See goals above   Remaining deficits: Patient reports return to prior level of function   Education / Equipment: HEP  Plan: Patient agrees to discharge.  Patient goals were met. Patient is being discharged due to meeting the stated rehab goals.  ?????         Thank you for the referral of this patient. Rudell Cobb, MPT   [place], PT 12/07/2019, 1:08 PM  [facility] [place], [place], [address] Phone: [phone]   Fax:  [phone]  Name: Melanie Flores MRN: [id number] Date of Birth: 1946-03-15

## 2019-12-09 ENCOUNTER — Encounter: Payer: Medicare PPO | Admitting: Physical Therapy

## 2019-12-14 ENCOUNTER — Encounter: Payer: Medicare PPO | Admitting: Rehabilitative and Restorative Service Providers"

## 2019-12-16 ENCOUNTER — Encounter: Payer: Medicare PPO | Admitting: Physical Therapy

## 2020-06-07 ENCOUNTER — Other Ambulatory Visit: Payer: Self-pay

## 2020-06-07 ENCOUNTER — Ambulatory Visit (INDEPENDENT_AMBULATORY_CARE_PROVIDER_SITE_OTHER): Payer: Medicare PPO | Admitting: Rehabilitative and Restorative Service Providers"

## 2020-06-07 DIAGNOSIS — R293 Abnormal posture: Secondary | ICD-10-CM

## 2020-06-07 DIAGNOSIS — M542 Cervicalgia: Secondary | ICD-10-CM

## 2020-06-07 DIAGNOSIS — M6281 Muscle weakness (generalized): Secondary | ICD-10-CM

## 2020-06-07 NOTE — Therapy (Signed)
[place], [place], [address] Phone: [phone]   Fax:  [phone]  Physical Therapy Evaluation  Patient Details  Name: Melanie Flores MRN: [id number] Date of Birth: Nov 04, 1945 Referring Provider (PT): Marya Landry, NP   Encounter Date: 06/07/2020   PT End of Session - 06/07/20 1506    Visit Number 1    Number of Visits 12    Date for PT Re-Evaluation 07/19/20    Authorization Type humana    PT Start Time 0930    PT Stop Time 1025    PT Time Calculation (min) 55 min    Activity Tolerance Patient limited by pain    Behavior During Therapy [facility] for tasks assessed/performed           Past Medical History:  Diagnosis Date  . Arthritis   . Breast cancer ([place])   . Cancer ([place])    ovarian, breast  . Chronic bronchitis ([place])   . COPD (chronic obstructive pulmonary disease) ([place])   . Depression   . Dyspnea    exertion  . Hypertension     Past Surgical History:  Procedure Laterality Date  . ABDOMINAL HYSTERECTOMY    . BREAST SURGERY    . CESAREAN SECTION    . CHOLECYSTECTOMY    . ELBOW LIGAMENT RECONSTRUCTION Right   . KNEE SURGERY    . SPLENECTOMY, TOTAL    . TONSILLECTOMY    . TOTAL KNEE ARTHROPLASTY Right 10/30/2019   Procedure: TOTAL KNEE ARTHROPLASTY;  Surgeon: Dorna Leitz, MD;  Location: [facility];  Service: Orthopedics;  Laterality: Right;    There were no vitals filed for this visit.    Subjective Assessment - 06/07/20 0939    Subjective The patient awoke with neck stiffness on 05/13/20.  The pain has gotten worse and she notes swelling and pain in suboccipitals, into SCM, and into chest.  She reports tightness in her neck when sitting still.    Pertinent History arthritis, breast cancer, COPD, depression, HTN    Patient Stated Goals reduce stiffness and pain in neck    Currently in Pain? Yes    Pain Score 7     Pain Location Neck    Pain Orientation  Right;Left;Upper;Mid;Lower    Pain Descriptors / Indicators Aching    Pain Type Acute pain    Pain Radiating Towards into chest    Pain Frequency Constant    Aggravating Factors  varies in intensity    Pain Relieving Factors ibuprofen helps              [facility] PT Assessment - 06/07/20 0946      Assessment   Medical Diagnosis cervicalgia    Referring Provider (PT) Marya Landry, NP    Onset Date/Surgical Date 05/13/20    Hand Dominance Left    Prior Therapy known to our clinic from prior PT for knee; none for this condition      Precautions   Precautions None      Restrictions   Weight Bearing Restrictions No      Balance Screen   Has the patient fallen in the past 6 months No    Has the patient had a decrease in activity level because of a fear of falling?  No    Is the patient reluctant to leave their home because of a fear of falling?  No      Home Ecologist residence  Living Arrangements Spouse/significant other    Type of [place] to enter    Entrance Stairs-Rails None    Home Layout One level      Prior Function   Level of Independence Independent      Observation/Other Assessments   Focus on Therapeutic Outcomes (FOTO)  61% limitation      Sensation   Light Touch Appears Intact      ROM / Strength   AROM / PROM / Strength AROM;Strength      AROM   Overall AROM  Deficits    AROM Assessment Site Cervical    Cervical Flexion 18    Cervical Extension 15    Cervical - Right Side Bend 18    Cervical - Left Side Bend 18    Cervical - Right Rotation 28    Cervical - Left Rotation 30      Strength   Overall Strength Deficits    Overall Strength Comments pain with isometric sidebending and retraction; no pain with isometric neck rotation; UEs are [facility]      Flexibility   Soft Tissue Assessment /Muscle Length yes   tightness in anterior pecs and suboccipitals     Palpation   Spinal mobility  muscular guarding limits assessment    Palpation comment tender to palpation bilat upper trap, scalenes, splenius capitus, suboccipitals                      Objective measurements completed on examination: See above findings.       [facility] Adult PT Treatment/Exercise - 06/07/20 1018      Exercises   Exercises Neck      Neck Exercises: Supine   Cervical Isometrics Right lateral flexion;Left lateral flexion;5 secs;5 reps    Neck Retraction 5 reps    Capital Flexion 5 reps    Cervical Rotation Right;Left;5 reps    Other Supine Exercise V exercise for anterior chest opening.      Modalities   Modalities Electrical Stimulation;Moist Heat      Moist Heat Therapy   Number Minutes Moist Heat 12 Minutes    Moist Heat Location Cervical      Electrical Stimulation   Electrical Stimulation Location neck    Electrical Stimulation Action educated patient on use of her home TENS unit that she brought into therapy today    Electrical Stimulation Parameters to toleance    Electrical Stimulation Goals Pain      Manual Therapy   Manual Therapy Soft tissue mobilization    Manual therapy comments skilled palpation and assessment with dry needling    Soft tissue mobilization upper trapezius, suboccipitals and scalenes            Trigger Point Dry Needling - 06/07/20 1018    Consent Given? Yes    Education Handout Provided Yes    Muscles Treated Head and Neck Upper trapezius;Levator scapulae    Upper Trapezius Response Twitch reponse elicited;Palpable increased muscle length    Levator Scapulae Response Twitch response elicited;Palpable increased muscle length                PT Education - 06/07/20 1506    Education Details HEP    Person(s) Educated Patient    Methods Explanation;Demonstration;Handout    Comprehension Returned demonstration;Verbalized understanding               PT Long Term Goals - 06/07/20 1508  PT LONG TERM GOAL #1   Title The  patient will be independent with HEP.    Time 6    Period Weeks    Target Date 07/19/20      PT LONG TERM GOAL #2   Title The patient will reduce functional limitation per FOTO from 61% to < or equal to 42%.    Time 6    Period Weeks    Target Date 07/19/20      PT LONG TERM GOAL #3   Title The patient will improve AROM c-spine to 50 degrees bilateral rotation.    Time 6    Period Weeks    Target Date 07/19/20      PT LONG TERM GOAL #4   Title The patient will improve AROM c-spine to 30 degrees flexion/extension.    Time 6    Period Weeks    Target Date 07/19/20      PT LONG TERM GOAL #5   Title The patient will report resting neck pain < or equal to2 /10    Baseline 7/10    Time 6    Period Weeks    Target Date 07/19/20                  Plan - 06/07/20 1958    Clinical Impression Statement The patient is a 74 yo female presenting to OP physical therapy with acute neck pain with onset in September 2021.  She presents with impairments in AROM c-spine, tenderness to palpation of paraspinal musculature, myofascial tightness, trigger points, and postural guarding.  She is functionally limited with driving, looking up, and daily tasks including reaching overhead.  PT to address to return to prior functional status.    Personal Factors and Comorbidities Comorbidity 2    Comorbidities HTN, arthritis    Examination-Activity Limitations Lift;Reach Overhead    Examination-Participation Restrictions Driving;Community Activity    Stability/Clinical Decision Making Stable/Uncomplicated    Clinical Decision Making Low    Rehab Potential Good    PT Frequency 2x / week    PT Duration 6 weeks    PT Treatment/Interventions ADLs/Self Care Home Management;Patient/family education;Therapeutic activities;Therapeutic exercise;Electrical Stimulation;Moist Heat;Traction;Manual techniques;Dry needling;Passive range of motion;Taping    PT Next Visit Plan isometrics c-spine, AROM/PROM neck,  dry needling cervical muculature (splenius capitus, suboccipitals, upper trap), joint mobs to tolerance, postural opening/postural strengthening    PT Home Exercise Plan Access Code: [email or web address]    Consulted and Agree with Plan of Care Patient           Patient will benefit from skilled therapeutic intervention in order to improve the following deficits and impairments:  Pain, Impaired flexibility, Postural dysfunction, Decreased strength, Decreased range of motion, Hypomobility, Increased fascial restricitons, Increased muscle spasms  Visit Diagnosis: Cervicalgia  Muscle weakness (generalized)  Abnormal posture     Problem List Patient Active Problem List   Diagnosis Date Noted  . Primary osteoarthritis of right knee 09/03/2018  . Fracture of great toe, left, closed 08/05/2018  . 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 HYPERTENSION 06/24/2008    [place], PT 06/07/2020, 8:03 PM  [facility] [place], [place], [address] Phone: [phone]   Fax:  [phone]  Name: Melanie Flores MRN: [id number] Date of Birth: 09/21/1945

## 2020-06-07 NOTE — Patient Instructions (Signed)
Access Code: [email or web address] URL: [email or web address] Date: 06/07/2020 Prepared by: Rudell Cobb  Exercises Supine Thoracic Mobilization Foam Roll Horizontal with Arm Stretch - 2 x daily - 7 x weekly - 1 sets - 1 reps - 2 minutes hold Supine Chin Tuck - 2 x daily - 7 x weekly - 1 sets - 5 reps - 5 seconds hold Supine Cervical Rotation AROM on Pillow - 2 x daily - 7 x weekly - 1 sets - 10 reps Supine Cervical Flexion Extension on Pillow - 2 x daily - 7 x weekly - 1 sets - 10 reps Supine Isometric Neck Sidebend (Lateral Flexion as an alternative name) - 2 x daily - 7 x weekly - 1 sets - 5 reps - 5 seconds hold  Patient Education Trigger Point Dry Needling

## 2020-06-10 ENCOUNTER — Ambulatory Visit (INDEPENDENT_AMBULATORY_CARE_PROVIDER_SITE_OTHER): Payer: Medicare PPO | Admitting: Rehabilitative and Restorative Service Providers"

## 2020-06-10 ENCOUNTER — Encounter: Payer: Self-pay | Admitting: Rehabilitative and Restorative Service Providers"

## 2020-06-10 ENCOUNTER — Other Ambulatory Visit: Payer: Self-pay

## 2020-06-10 DIAGNOSIS — R293 Abnormal posture: Secondary | ICD-10-CM

## 2020-06-10 DIAGNOSIS — M542 Cervicalgia: Secondary | ICD-10-CM

## 2020-06-10 DIAGNOSIS — M6281 Muscle weakness (generalized): Secondary | ICD-10-CM | POA: Diagnosis not present

## 2020-06-10 NOTE — Therapy (Signed)
[place], [place], [address] Phone: [phone]   Fax:  [phone]  Physical Therapy Treatment  Patient Details  Name: Melanie Flores MRN: [id number] Date of Birth: February 05, 1946 Referring Provider (PT): Marya Landry, NP   Encounter Date: 06/10/2020   PT End of Session - 06/10/20 1454    Visit Number 2    Number of Visits 12    Date for PT Re-Evaluation 07/19/20    Authorization Type humana    PT Start Time 1449    PT Stop Time 1528    PT Time Calculation (min) 39 min    Activity Tolerance Patient limited by pain    Behavior During Therapy [facility] for tasks assessed/performed           Past Medical History:  Diagnosis Date  . Arthritis   . Breast cancer ([place])   . Cancer ([place])    ovarian, breast  . Chronic bronchitis ([place])   . COPD (chronic obstructive pulmonary disease) ([place])   . Depression   . Dyspnea    exertion  . Hypertension     Past Surgical History:  Procedure Laterality Date  . ABDOMINAL HYSTERECTOMY    . BREAST SURGERY    . CESAREAN SECTION    . CHOLECYSTECTOMY    . ELBOW LIGAMENT RECONSTRUCTION Right   . KNEE SURGERY    . SPLENECTOMY, TOTAL    . TONSILLECTOMY    . TOTAL KNEE ARTHROPLASTY Right 10/30/2019   Procedure: TOTAL KNEE ARTHROPLASTY;  Surgeon: Dorna Leitz, MD;  [facility];  Service: Orthopedics;  Laterality: Right;    There were no vitals filed for this visit.   Subjective Assessment - 06/10/20 1452    Subjective The patient reports she continues to feel locked up.  She is using her TENS unit about 6 times/day.  She overall feels worse after dry needling.    Pertinent History arthritis, breast cancer, COPD, depression, HTN    Patient Stated Goals reduce stiffness and pain in neck    Currently in Pain? Yes    Pain Score 8     Pain Location Neck    Pain Orientation Right;Left;Mid;Upper    Pain Descriptors / Indicators Aching;Discomfort;Sore;Spasm      Pain Type Acute pain    Pain Onset More than a month ago    Pain Frequency Constant    Aggravating Factors  varies in intensity    Pain Relieving Factors ibuprofen and TENS help              [facility] PT Assessment - 06/10/20 1504      Assessment   Medical Diagnosis cervicalgia    Referring Provider (PT) Marya Landry, NP    Onset Date/Surgical Date 05/13/20    Hand Dominance Left      AROM   Cervical - Right Rotation 25   when supine   Cervical - Left Rotation 25   when supine                        [facility] Adult PT Treatment/Exercise - 06/10/20 1454      Exercises   Exercises Neck;Shoulder      Neck Exercises: Seated   Other Seated Exercise shoulder shrugs x 12 reps      Neck Exercises: Supine   Cervical Isometrics Flexion;Right rotation;Left rotation;3 secs    Neck Retraction 5 reps    Neck Retraction Limitations with scapular retraction  Capital Flexion 5 reps    Cervical Rotation Right;Left;5 reps      Neck Exercises: Sidelying   Lateral Flexion Right;5 reps    Lateral Flexion Limitations with pain noted    Other Sidelying Exercise scapular mobilization PROM in sidelying      Shoulder Exercises: Supine   Horizontal ABduction AROM;5 reps;Both    Other Supine Exercises supine spinal rotation x 4 reps      Shoulder Exercises: Seated   External Rotation Strengthening;Both;12 reps      Modalities   Modalities Electrical Stimulation;Moist Heat      Moist Heat Therapy   Number Minutes Moist Heat 12 Minutes    Moist Heat Location Cervical      Electrical Stimulation   Electrical Stimulation Location neck    Electrical Stimulation Action interferential    Electrical Stimulation Parameters to tolerance    Electrical Stimulation Goals Pain      Manual Therapy   Manual Therapy Soft tissue mobilization    Soft tissue mobilization parascapular mobilization to tolerance                  PT Education - 06/10/20 1523    Education  Details HEP    Person(s) Educated Patient    Methods Explanation;Demonstration;Handout    Comprehension Verbalized understanding;Returned demonstration               PT Long Term Goals - 06/07/20 1508      PT LONG TERM GOAL #1   Title The patient will be independent with HEP.    Time 6    Period Weeks    Target Date 07/19/20      PT LONG TERM GOAL #2   Title The patient will reduce functional limitation per FOTO from 61% to < or equal to 42%.    Time 6    Period Weeks    Target Date 07/19/20      PT LONG TERM GOAL #3   Title The patient will improve AROM c-spine to 50 degrees bilateral rotation.    Time 6    Period Weeks    Target Date 07/19/20      PT LONG TERM GOAL #4   Title The patient will improve AROM c-spine to 30 degrees flexion/extension.    Time 6    Period Weeks    Target Date 07/19/20      PT LONG TERM GOAL #5   Title The patient will report resting neck pain < or equal to2 /10    Baseline 7/10    Time 6    Period Weeks    Target Date 07/19/20                 Plan - 06/10/20 1528    Clinical Impression Statement The patient continues with significant ROM loss in c-spine.  PT performed gentle ther ex to tolerance with modalities with goal to decrease cervical guarding and improve overall mbility.  Continue working to dec'd muscle guarding in order to move into joint mobilization and ROM as able.    Comorbidities HTN, arthritis    Stability/Clinical Decision Making Stable/Uncomplicated    Rehab Potential Good    PT Frequency 2x / week    PT Duration 6 weeks    PT Treatment/Interventions ADLs/Self Care Home Management;Patient/family education;Therapeutic activities;Therapeutic exercise;Electrical Stimulation;Moist Heat;Traction;Manual techniques;Dry needling;Passive range of motion;Taping    PT Next Visit Plan isometrics c-spine, AROM/PROM neck, dry needling cervical muculature (splenius capitus, suboccipitals, upper trap), joint  mobs to  tolerance, postural opening/postural strengthening    PT Home Exercise Plan Access Code: [email or web address]    Consulted and Agree with Plan of Care Patient           Patient will benefit from skilled therapeutic intervention in order to improve the following deficits and impairments:  Pain, Impaired flexibility, Postural dysfunction, Decreased strength, Decreased range of motion, Hypomobility, Increased fascial restricitons, Increased muscle spasms  Visit Diagnosis: Cervicalgia  Muscle weakness (generalized)  Abnormal posture     Problem List Patient Active Problem List   Diagnosis Date Noted  . Primary osteoarthritis of right knee 09/03/2018  . Fracture of great toe, left, closed 08/05/2018  . 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 HYPERTENSION 06/24/2008    [place], PT 06/10/2020, 3:29 PM  [facility] [place], [place], [address] Phone: [phone]   Fax:  [phone]  Name: Melanie Flores MRN: [id number] Date of Birth: July 09, 1946

## 2020-06-10 NOTE — Patient Instructions (Signed)
Access Code: [email or web address] URL: [email or web address] Date: 06/10/2020 Prepared by: Rudell Cobb  Exercises Supine Thoracic Mobilization Foam Roll Horizontal with Arm Stretch - 2 x daily - 7 x weekly - 1 sets - 1 reps - 2 minutes hold Supine Chin Tuck - 2 x daily - 7 x weekly - 1 sets - 5 reps - 5 seconds hold Supine Cervical Rotation AROM on Pillow - 2 x daily - 7 x weekly - 1 sets - 10 reps Supine Cervical Flexion Extension on Pillow - 2 x daily - 7 x weekly - 1 sets - 10 reps Supine Isometric Neck Sidebend (Lateral Flexion as an alternative name) - 2 x daily - 7 x weekly - 1 sets - 5 reps - 5 seconds hold Seated Shoulder Shrugs - 2 x daily - 7 x weekly - 1 sets - 10 reps Seated Shoulder External Rotation - 2 x daily - 7 x weekly - 1 sets - 10 reps

## 2020-06-13 ENCOUNTER — Ambulatory Visit (INDEPENDENT_AMBULATORY_CARE_PROVIDER_SITE_OTHER): Payer: Medicare PPO | Admitting: Physical Therapy

## 2020-06-13 ENCOUNTER — Other Ambulatory Visit: Payer: Self-pay

## 2020-06-13 ENCOUNTER — Encounter: Payer: Self-pay | Admitting: Physical Therapy

## 2020-06-13 DIAGNOSIS — M542 Cervicalgia: Secondary | ICD-10-CM | POA: Diagnosis not present

## 2020-06-13 DIAGNOSIS — M6281 Muscle weakness (generalized): Secondary | ICD-10-CM | POA: Diagnosis not present

## 2020-06-13 DIAGNOSIS — R293 Abnormal posture: Secondary | ICD-10-CM

## 2020-06-13 NOTE — Therapy (Signed)
[place], [place], [address] Phone: [phone]   Fax:  [phone]  Physical Therapy Treatment  Patient Details  Name: Melanie Flores MRN: [id number] Date of Birth: Apr 01, 1946 Referring Provider (PT): Marya Landry, NP   Encounter Date: 06/13/2020   PT End of Session - 06/13/20 1034    Visit Number 3    Number of Visits 12    Date for PT Re-Evaluation 07/19/20    Authorization Type humana    PT Start Time 1034    PT Stop Time 1123    PT Time Calculation (min) 49 min    Activity Tolerance Patient limited by pain    Behavior During Therapy [facility] for tasks assessed/performed           Past Medical History:  Diagnosis Date  . Arthritis   . Breast cancer ([place])   . Cancer ([place])    ovarian, breast  . Chronic bronchitis ([place])   . COPD (chronic obstructive pulmonary disease) ([place])   . Depression   . Dyspnea    exertion  . Hypertension     Past Surgical History:  Procedure Laterality Date  . ABDOMINAL HYSTERECTOMY    . BREAST SURGERY    . CESAREAN SECTION    . CHOLECYSTECTOMY    . ELBOW LIGAMENT RECONSTRUCTION Right   . KNEE SURGERY    . SPLENECTOMY, TOTAL    . TONSILLECTOMY    . TOTAL KNEE ARTHROPLASTY Right 10/30/2019   Procedure: TOTAL KNEE ARTHROPLASTY;  Surgeon: Dorna Leitz, MD;  Location[facility];  Service: Orthopedics;  Laterality: Right;    There were no vitals filed for this visit.   Subjective Assessment - 06/13/20 1035    Subjective It's a little a better. It's moving around. Today it's on the left side.    Pertinent History arthritis, breast cancer, COPD, depression, HTN    Patient Stated Goals reduce stiffness and pain in neck    Currently in Pain? Yes    Pain Score 7     Pain Location Neck    Pain Orientation Left;Mid;Upper    Pain Descriptors / Indicators Aching                             [facility] Adult PT Treatment/Exercise - 06/13/20 0001        Self-Care   Self-Care Other Self-Care Comments    Other Self-Care Comments  use of towel roll in pillow and self traction      Neck Exercises: Seated   Cervical Isometrics Right lateral flexion;Left lateral flexion;5 secs;5 reps    Neck Retraction 5 reps;3 secs    Cervical Rotation Both;5 reps    Cervical Rotation Limitations painful at end range bil    Other Seated Exercise shoulder shrugs x 12 reps      Neck Exercises: Supine   Other Supine Exercise self manual traction with towel 2 x 20 sec      Shoulder Exercises: Seated   External Rotation Strengthening;Both;12 reps      Manual Therapy   Manual Therapy Joint mobilization;Soft tissue mobilization    Joint Mobilization in supine and prone; UPA mobs cspine and lateral glides; upper cervical rotation mobs    Soft tissue mobilization in supine to bil UT, scalenes and supoccipitals with OA release      Neck Exercises: Stretches   Upper Trapezius Stretch Right;Left;2 reps;20 seconds    Neck Stretch  3 reps;20 seconds   ant neck; straight and then each side                 PT Education - 06/13/20 1131    Education Details HEP progressed; self care traction and sleeping    Person(s) Educated Patient    Methods Explanation;Demonstration;Handout    Comprehension Verbalized understanding;Returned demonstration               PT Long Term Goals - 06/07/20 1508      PT LONG TERM GOAL #1   Title The patient will be independent with HEP.    Time 6    Period Weeks    Target Date 07/19/20      PT LONG TERM GOAL #2   Title The patient will reduce functional limitation per FOTO from 61% to < or equal to 42%.    Time 6    Period Weeks    Target Date 07/19/20      PT LONG TERM GOAL #3   Title The patient will improve AROM c-spine to 50 degrees bilateral rotation.    Time 6    Period Weeks    Target Date 07/19/20      PT LONG TERM GOAL #4   Title The patient will improve AROM c-spine to 30 degrees  flexion/extension.    Time 6    Period Weeks    Target Date 07/19/20      PT LONG TERM GOAL #5   Title The patient will report resting neck pain < or equal to2 /10    Baseline 7/10    Time 6    Period Weeks    Target Date 07/19/20                 Plan - 06/13/20 1129    Clinical Impression Statement Patient reporting a small improvement today, but pain has shifted to left side. She is very tight in left SCM and scalenes (tight on right too). Mobs were not painful. She had less pain with seated rotation when combined with neck retraction. Good relief with supine traction, but no change in pain with seated traction and rotation combined. LTGs are ongoing. Neck stretches added to HEP.    Personal Factors and Comorbidities Comorbidity 2    Comorbidities HTN, arthritis    Examination-Activity Limitations Lift;Reach Overhead    Examination-Participation Restrictions Driving;Community Activity    PT Frequency 2x / week    PT Duration 6 weeks    PT Treatment/Interventions ADLs/Self Care Home Management;Patient/family education;Therapeutic activities;Therapeutic exercise;Electrical Stimulation;Moist Heat;Traction;Manual techniques;Dry needling;Passive range of motion;Taping    PT Next Visit Plan isometrics c-spine, AROM/PROM neck, dry needling cervical muculature (splenius capitus, suboccipitals, upper trap), joint mobs to tolerance, postural opening/postural strengthening    PT Home Exercise Plan Access Code: [email or web address]    Consulted and Agree with Plan of Care Patient           Patient will benefit from skilled therapeutic intervention in order to improve the following deficits and impairments:  Pain, Impaired flexibility, Postural dysfunction, Decreased strength, Decreased range of motion, Hypomobility, Increased fascial restricitons, Increased muscle spasms  Visit Diagnosis: Cervicalgia  Muscle weakness (generalized)  Abnormal posture     Problem List Patient Active Problem  List   Diagnosis Date Noted  . Primary osteoarthritis of right knee 09/03/2018  . Fracture of great toe, left, closed 08/05/2018  . 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 HYPERTENSION 06/24/2008    Madelyn Flavors PT 06/13/2020, 11:35  AM  [facility] [place], [place], [address] Phone: [phone]   Fax:  [phone]  Name: ZOHAL RENY MRN: [id number] Date of Birth: [birth year]

## 2020-06-13 NOTE — Patient Instructions (Signed)
Access Code: [email or web address] URL: [email or web address] Date: 06/13/2020 Prepared by: Almyra Free  Exercises Supine Thoracic Mobilization Foam Roll Horizontal with Arm Stretch - 2 x daily - 7 x weekly - 1 sets - 1 reps - 2 minutes hold Supine Chin Tuck - 2 x daily - 7 x weekly - 1 sets - 5 reps - 5 seconds hold Supine Cervical Rotation AROM on Pillow - 2 x daily - 7 x weekly - 1 sets - 10 reps Supine Cervical Flexion Extension on Pillow - 2 x daily - 7 x weekly - 1 sets - 10 reps Supine Isometric Neck Sidebend (Lateral Flexion as an alternative name) - 2 x daily - 7 x weekly - 1 sets - 5 reps - 5 seconds hold Seated Shoulder Shrugs - 2 x daily - 7 x weekly - 1 sets - 10 reps Seated Shoulder External Rotation - 2 x daily - 7 x weekly - 1 sets - 10 reps Seated Gentle Upper Trapezius Stretch - 2 x daily - 7 x weekly - 3 reps - 1 sets - 30 sec hold Sternocleidomastoid Stretch - 2 x daily - 7 x weekly - 1 sets - 3 reps - 30 hold Seated Cervical Traction - 1 x daily - 7 x weekly - 1 sets - 3 reps - 20 sec hold

## 2020-06-15 ENCOUNTER — Encounter: Payer: Self-pay | Admitting: Rehabilitative and Restorative Service Providers"

## 2020-06-15 ENCOUNTER — Other Ambulatory Visit: Payer: Self-pay

## 2020-06-15 ENCOUNTER — Ambulatory Visit (INDEPENDENT_AMBULATORY_CARE_PROVIDER_SITE_OTHER): Payer: Medicare PPO | Admitting: Rehabilitative and Restorative Service Providers"

## 2020-06-15 DIAGNOSIS — M6281 Muscle weakness (generalized): Secondary | ICD-10-CM

## 2020-06-15 DIAGNOSIS — M542 Cervicalgia: Secondary | ICD-10-CM

## 2020-06-15 DIAGNOSIS — R293 Abnormal posture: Secondary | ICD-10-CM | POA: Diagnosis not present

## 2020-06-15 NOTE — Patient Instructions (Signed)
Access Code: [email or web address]: [email or web address]: 10/27/2021Prepared by: Khalel Alms HoltExercises  Supine Thoracic Mobilization Foam Roll Horizontal with Arm Stretch - 2 x daily - 7 x weekly - 1 sets - 1 reps - 2 minutes hold  Supine Chin Tuck - 2 x daily - 7 x weekly - 1 sets - 5 reps - 5 seconds hold  Supine Cervical Rotation AROM on Pillow - 2 x daily - 7 x weekly - 1 sets - 10 reps  Supine Cervical Flexion Extension on Pillow - 2 x daily - 7 x weekly - 1 sets - 10 reps  Supine Isometric Neck Sidebend (Lateral Flexion as an alternative name) - 2 x daily - 7 x weekly - 1 sets - 5 reps - 5 seconds hold  Seated Shoulder Shrugs - 2 x daily - 7 x weekly - 1 sets - 10 reps  Seated Shoulder External Rotation - 2 x daily - 7 x weekly - 1 sets - 10 reps  Seated Gentle Upper Trapezius Stretch - 2 x daily - 7 x weekly - 3 reps - 1 sets - 30 sec hold  Sternocleidomastoid Stretch - 2 x daily - 7 x weekly - 1 sets - 3 reps - 30 hold  Seated Cervical Traction - 1 x daily - 7 x weekly - 1 sets - 3 reps - 20 sec hold  Seated Cervical Retraction - 2 x daily - 7 x weekly - 1-2 sets - 5-10 reps - 10 sec hold  Seated Scapular Retraction - 2 x daily - 7 x weekly - 1-2 sets - 10 reps - 10 sec hold  Shoulder External Rotation and Scapular Retraction - 2 x daily - 7 x weekly - 1 sets - 3 reps - 30 sec hold  Shoulder External Rotation in 45 Degrees Abduction - 2 x daily - 7 x weekly - 1-2 sets - 10 reps - 3 sec hold  Doorway Pec Stretch at 60 Elevation - 2 x daily - 7 x weekly - 1 sets - 3 reps - 30 sec hold  Doorway Pec Stretch at 90 Degrees Abduction - 2 x daily - 7 x weekly - 1 sets - 3 reps - 30 sec hold

## 2020-06-15 NOTE — Therapy (Signed)
[place], [place], [address] Phone: [phone]   Fax:  [phone]  Physical Therapy Treatment  Patient Details  Name: Melanie Flores MRN: [id number] Date of Birth: 10-15-1945 Referring Provider (PT): Marya Landry, NP   Encounter Date: 06/15/2020   PT End of Session - 06/15/20 0850    Visit Number 4    Number of Visits 12    Date for PT Re-Evaluation 07/19/20    Authorization Type humana    PT Start Time 0851    PT Stop Time 0939    PT Time Calculation (min) 48 min    Activity Tolerance Patient limited by pain           Past Medical History:  Diagnosis Date  . Arthritis   . Breast cancer ([place])   . Cancer ([place])    ovarian, breast  . Chronic bronchitis ([place])   . COPD (chronic obstructive pulmonary disease) ([place])   . Depression   . Dyspnea    exertion  . Hypertension     Past Surgical History:  Procedure Laterality Date  . ABDOMINAL HYSTERECTOMY    . BREAST SURGERY    . CESAREAN SECTION    . CHOLECYSTECTOMY    . ELBOW LIGAMENT RECONSTRUCTION Right   . KNEE SURGERY    . SPLENECTOMY, TOTAL    . TONSILLECTOMY    . TOTAL KNEE ARTHROPLASTY Right 10/30/2019   Procedure: TOTAL KNEE ARTHROPLASTY;  Surgeon: Dorna Leitz, MD;  Location: [facility];  Service: Orthopedics;  Laterality: Right;    There were no vitals filed for this visit.   Subjective Assessment - 06/15/20 0853    Subjective Patient reports that her neck is feeling a little bit better for the first time in a while, She has some continued pain but not as much as it was.    Currently in Pain? Yes    Pain Score 7     Pain Location Neck    Pain Orientation Left;Mid;Upper;Right    Pain Descriptors / Indicators Aching;Tightness    Pain Type Acute pain                             [facility] Adult PT Treatment/Exercise - 06/15/20 0001      Neck Exercises: Seated   Cervical Isometrics Right lateral  flexion;Left lateral flexion;5 secs;5 reps    Neck Retraction 5 reps;3 secs    Cervical Rotation Both;5 reps    Lateral Flexion Right;Left;5 reps    Other Seated Exercise shoulder shrugs x 12 reps      Shoulder Exercises: Standing   Other Standing Exercises scap squeeze 10 sec x 10 reps; axial extension 10 sec hold x 5 reps; L's x 10; W's x 10 with noodle       Shoulder Exercises: Stretch   Other Shoulder Stretches doorway stretch lower 2 positions x 3 reps x 30 sec hold       Moist Heat Therapy   Number Minutes Moist Heat 10 Minutes    Moist Heat Location Cervical   thoracic spine      Manual Therapy   Manual therapy comments pt supine     Joint Mobilization in supine UPA and lateral  mobs through the upper thoracic and cervical spine; mobs through the 1st rib and calvical     Soft tissue mobilization working through ant/lat/post cervical musculature; upper traps; Holiday representative; pecs  Myofascial Release anterior chest/pecs    Manual Traction 20 sec x 2                  PT Education - 06/15/20 0909    Education Details HEP    Person(s) Educated Patient    Methods Explanation;Demonstration;Tactile cues;Verbal cues;Handout    Comprehension Verbalized understanding;Returned demonstration;Verbal cues required;Tactile cues required               PT Long Term Goals - 06/07/20 1508      PT LONG TERM GOAL #1   Title The patient will be independent with HEP.    Time 6    Period Weeks    Target Date 07/19/20      PT LONG TERM GOAL #2   Title The patient will reduce functional limitation per FOTO from 61% to < or equal to 42%.    Time 6    Period Weeks    Target Date 07/19/20      PT LONG TERM GOAL #3   Title The patient will improve AROM c-spine to 50 degrees bilateral rotation.    Time 6    Period Weeks    Target Date 07/19/20      PT LONG TERM GOAL #4   Title The patient will improve AROM c-spine to 30 degrees flexion/extension.    Time 6    Period Weeks     Target Date 07/19/20      PT LONG TERM GOAL #5   Title The patient will report resting neck pain < or equal to2 /10    Baseline 7/10    Time 6    Period Weeks    Target Date 07/19/20                 Plan - 06/15/20 0855    Clinical Impression Statement Improvement from last visit. Patient is working on the [employer]. Continued with exercises and manual work. Working on thoracic extension and postural correction. Continued muscular tightness noted Lt > Rt SCM and scaleni as well as through the pecs and upper traps. Tender with CPA mobs upper thorcic spine. Offewred suggestions for modifications in sitting.    Rehab Potential Good    PT Frequency 2x / week    PT Duration 6 weeks    PT Treatment/Interventions ADLs/Self Care Home Management;Patient/family education;Therapeutic activities;Therapeutic exercise;Electrical Stimulation;Moist Heat;Traction;Manual techniques;Dry needling;Passive range of motion;Taping    PT Next Visit Plan isometrics c-spine, AROM/PROM neck, dry needling cervical muculature (splenius capitus, suboccipitals, upper trap), joint mobs to tolerance, postural opening/postural strengthening    PT Home Exercise Plan [email or web address]    Consulted and Agree with Plan of Care Patient           Patient will benefit from skilled therapeutic intervention in order to improve the following deficits and impairments:     Visit Diagnosis: Cervicalgia  Muscle weakness (generalized)  Abnormal posture     Problem List Patient Active Problem List   Diagnosis Date Noted  . Primary osteoarthritis of right knee 09/03/2018  . Fracture of great toe, left, closed 08/05/2018  . 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 HYPERTENSION 06/24/2008    Melanie Flores PT, MPH  06/15/2020, 9:32 AM  [facility] [place], [place], [address] Phone: [phone]   Fax:  [phone]  Name: Melanie Flores MRN: [id number] Date of  Birth: June 29, 1946

## 2020-06-16 ENCOUNTER — Encounter: Payer: Medicare PPO | Admitting: Physical Therapy

## 2020-06-21 ENCOUNTER — Ambulatory Visit (INDEPENDENT_AMBULATORY_CARE_PROVIDER_SITE_OTHER): Payer: Medicare PPO | Admitting: Rehabilitative and Restorative Service Providers"

## 2020-06-21 ENCOUNTER — Encounter: Payer: Self-pay | Admitting: Rehabilitative and Restorative Service Providers"

## 2020-06-21 ENCOUNTER — Other Ambulatory Visit: Payer: Self-pay

## 2020-06-21 DIAGNOSIS — R293 Abnormal posture: Secondary | ICD-10-CM | POA: Diagnosis not present

## 2020-06-21 DIAGNOSIS — M542 Cervicalgia: Secondary | ICD-10-CM | POA: Diagnosis not present

## 2020-06-21 DIAGNOSIS — M6281 Muscle weakness (generalized): Secondary | ICD-10-CM | POA: Diagnosis not present

## 2020-06-21 NOTE — Patient Instructions (Signed)
Access Code: [email or web address] URL: [email or web address] Date: 06/21/2020 Prepared by: Rudell Cobb  Exercises Seated Isometric Cervical Sidebending - 2 x daily - 7 x weekly - 1 sets - 5 reps - 5 seconds hold Seated Cervical Rotation AROM - 2 x daily - 7 x weekly - 1 sets - 10 reps Seated Cervical Extension AROM - 2 x daily - 7 x weekly - 1 sets - 10 reps Seated Upper Trapezius Stretch - 2 x daily - 7 x weekly - 1 sets - 3 reps - 20 seconds hold Sternocleidomastoid Stretch - 2 x daily - 7 x weekly - 1 sets - 3 reps - 30 hold Seated Cervical Traction - 1 x daily - 7 x weekly - 1 sets - 3 reps - 20 sec hold Seated Cervical Retraction - 2 x daily - 7 x weekly - 1-2 sets - 5-10 reps - 10 sec hold Shoulder External Rotation and Scapular Retraction - 2 x daily - 7 x weekly - 1 sets - 3 reps - 30 sec hold Shoulder External Rotation in 45 Degrees Abduction - 2 x daily - 7 x weekly - 1-2 sets - 10 reps - 3 sec hold Doorway Pec Stretch at 60 Elevation - 2 x daily - 7 x weekly - 1 sets - 3 reps - 30 sec hold

## 2020-06-21 NOTE — Therapy (Signed)
[place], [place], [address] Phone: [phone]   Fax:  [phone]  Physical Therapy Treatment  Patient Details  Name: Melanie Flores MRN: [id number] Date of Birth: [birth year] Referring Provider (PT): Marya Landry, NP   Encounter Date: 06/21/2020   PT End of Session - 06/21/20 1254    Visit Number 5    Number of Visits 12    Date for PT Re-Evaluation 07/19/20    Authorization Type humana    Authorization Time Period 10/19-11/30/21    Authorization - Visit Number 5    Authorization - Number of Visits 12    PT Start Time 1104    PT Stop Time 3474    PT Time Calculation (min) 41 min    Activity Tolerance Patient limited by pain;Patient tolerated treatment well    Behavior During Therapy [facility] for tasks assessed/performed           Past Medical History:  Diagnosis Date  . Arthritis   . Breast cancer ([place])   . Cancer ([place])    ovarian, breast  . Chronic bronchitis ([place])   . COPD (chronic obstructive pulmonary disease) ([place])   . Depression   . Dyspnea    exertion  . Hypertension     Past Surgical History:  Procedure Laterality Date  . ABDOMINAL HYSTERECTOMY    . BREAST SURGERY    . CESAREAN SECTION    . CHOLECYSTECTOMY    . ELBOW LIGAMENT RECONSTRUCTION Right   . KNEE SURGERY    . SPLENECTOMY, TOTAL    . TONSILLECTOMY    . TOTAL KNEE ARTHROPLASTY Right 10/30/2019   Procedure: TOTAL KNEE ARTHROPLASTY;  Surgeon: Dorna Leitz, MD;  Location: [facility];  Service: Orthopedics;  Laterality: Right;    There were no vitals filed for this visit.   Subjective Assessment - 06/21/20 1102    Subjective The patient reports 5-6/10 pain and some difficulty sleeping.  She was supposed to see PA yesterday, but the schedule was mixed up.    Pertinent History arthritis, breast cancer, COPD, depression, HTN    Patient Stated Goals reduce stiffness and pain in neck    Currently in Pain? Yes    Pain  Score 5    5-6/10   Pain Location Neck    Pain Descriptors / Indicators Tightness;Aching    Pain Type Acute pain    Pain Onset More than a month ago    Pain Frequency Constant    Aggravating Factors  looking up to top shelf    Pain Relieving Factors TENS and ther ex help              [facility] PT Assessment - 06/21/20 1106      Assessment   Medical Diagnosis cervicalgia    Referring Provider (PT) Marya Landry, NP    Onset Date/Surgical Date 05/13/20    Hand Dominance Left      Precautions   Precautions None      AROM   AROM Assessment Site Cervical    Cervical Flexion 40    Cervical Extension 25    Cervical - Right Rotation 55    Cervical - Left Rotation 45                         [facility] Adult PT Treatment/Exercise - 06/21/20 1106      Exercises   Exercises Neck;Shoulder      Neck Exercises: Seated  Cervical Isometrics Right lateral flexion;Left lateral flexion    Cervical Isometrics Limitations 5 seconds x 5 reps    Neck Retraction 10 reps    Cervical Rotation Both;5 reps    Lateral Flexion Right;Left;5 reps    Other Seated Exercise seated SCM stretch, seated upper trap stretch    Other Seated Exercise neck flexion/extension      Neck Exercises: Sidelying   Lateral Flexion Right;Left;5 reps      Shoulder Exercises: Standing   External Rotation Strengthening;Both;10 reps    Retraction Strengthening;Both;10 reps      Manual Therapy   Manual Therapy Joint mobilization;Soft tissue mobilization;Myofascial release;Scapular mobilization    Manual therapy comments To reduce muscle tightness and improve mobility    Joint Mobilization in prone CPA c-spine mid to low c-spine, supine PA low to mid c-spine    Soft tissue mobilization scalenes, splenius capitus, upper trap, parascapular musculature    Myofascial Release anterior chest/pecs/ SCM; suboccipital release    Scapular Mobilization sidelying scapular mobility                  PT  Education - 06/21/20 1147    Education Details HEP modified/updated to reduce #s    Person(s) Educated Patient    Methods Explanation;Demonstration;Handout    Comprehension Verbalized understanding;Returned demonstration               PT Long Term Goals - 06/07/20 1508      PT LONG TERM GOAL #1   Title The patient will be independent with HEP.    Time 6    Period Weeks    Target Date 07/19/20      PT LONG TERM GOAL #2   Title The patient will reduce functional limitation per FOTO from 61% to < or equal to 42%.    Time 6    Period Weeks    Target Date 07/19/20      PT LONG TERM GOAL #3   Title The patient will improve AROM c-spine to 50 degrees bilateral rotation.    Time 6    Period Weeks    Target Date 07/19/20      PT LONG TERM GOAL #4   Title The patient will improve AROM c-spine to 30 degrees flexion/extension.    Time 6    Period Weeks    Target Date 07/19/20      PT LONG TERM GOAL #5   Title The patient will report resting neck pain < or equal to2 /10    Baseline 7/10    Time 6    Period Weeks    Target Date 07/19/20                 Plan - 06/21/20 1301    Clinical Impression Statement The patient is progressing with A/ROM in c-spine.  She continues with point tenderness to palpation in c-spine paraspinals, scalenes, upper trap and SCM.  PT to continue working to LTGs dec'ing muscle guarding and pain to improve ROM and strength.    Rehab Potential Good    PT Frequency 2x / week    PT Duration 6 weeks    PT Treatment/Interventions ADLs/Self Care Home Management;Patient/family education;Therapeutic activities;Therapeutic exercise;Electrical Stimulation;Moist Heat;Traction;Manual techniques;Dry needling;Passive range of motion;Taping    PT Next Visit Plan isometrics c-spine, AROM/PROM neck, dry needling cervical muculature (splenius capitus, suboccipitals, upper trap), joint mobs to tolerance, postural opening/postural strengthening    PT Home Exercise  Plan [email or web address]    Consulted and  Agree with Plan of Care Patient           Patient will benefit from skilled therapeutic intervention in order to improve the following deficits and impairments:     Visit Diagnosis: Cervicalgia  Muscle weakness (generalized)  Abnormal posture     Problem List Patient Active Problem List   Diagnosis Date Noted  . Primary osteoarthritis of right knee 09/03/2018  . Fracture of great toe, left, closed 08/05/2018  . 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 HYPERTENSION 06/24/2008    [place] , PT 06/21/2020, 1:02 PM  [facility] [place], [place], [address] Phone: [phone]   Fax:  [phone]  Name: Melanie Flores MRN: [id number] Date of Birth: 1946/07/12

## 2020-06-23 ENCOUNTER — Other Ambulatory Visit: Payer: Self-pay

## 2020-06-23 ENCOUNTER — Encounter: Payer: Self-pay | Admitting: Physical Therapy

## 2020-06-23 ENCOUNTER — Ambulatory Visit (INDEPENDENT_AMBULATORY_CARE_PROVIDER_SITE_OTHER): Payer: Medicare PPO | Admitting: Physical Therapy

## 2020-06-23 DIAGNOSIS — R2689 Other abnormalities of gait and mobility: Secondary | ICD-10-CM

## 2020-06-23 DIAGNOSIS — M25561 Pain in right knee: Secondary | ICD-10-CM

## 2020-06-23 DIAGNOSIS — M542 Cervicalgia: Secondary | ICD-10-CM

## 2020-06-23 DIAGNOSIS — M6281 Muscle weakness (generalized): Secondary | ICD-10-CM | POA: Diagnosis not present

## 2020-06-23 DIAGNOSIS — R600 Localized edema: Secondary | ICD-10-CM

## 2020-06-23 DIAGNOSIS — R293 Abnormal posture: Secondary | ICD-10-CM

## 2020-06-23 NOTE — Therapy (Signed)
[place], [place], [address] Phone: [phone]   Fax:  [phone]  Physical Therapy Treatment  Patient Details  Name: Melanie Flores MRN: [id number] Date of Birth: 1946/07/10 Referring Provider (PT): Marya Landry, NP   Encounter Date: 06/23/2020   PT End of Session - 06/23/20 1101    Visit Number 6    Number of Visits 12    Date for PT Re-Evaluation 07/19/20    Authorization Type humana    Authorization Time Period 10/19-11/30/21    Authorization - Visit Number 6    Authorization - Number of Visits 12    PT Start Time 7989    PT Stop Time 1102    PT Time Calculation (min) 39 min    Activity Tolerance Patient limited by pain;Patient tolerated treatment well    Behavior During Therapy [facility] for tasks assessed/performed           Past Medical History:  Diagnosis Date  . Arthritis   . Breast cancer ([place])   . Cancer ([place])    ovarian, breast  . Chronic bronchitis ([place])   . COPD (chronic obstructive pulmonary disease) ([place])   . Depression   . Dyspnea    exertion  . Hypertension     Past Surgical History:  Procedure Laterality Date  . ABDOMINAL HYSTERECTOMY    . BREAST SURGERY    . CESAREAN SECTION    . CHOLECYSTECTOMY    . ELBOW LIGAMENT RECONSTRUCTION Right   . KNEE SURGERY    . SPLENECTOMY, TOTAL    . TONSILLECTOMY    . TOTAL KNEE ARTHROPLASTY Right 10/30/2019   Procedure: TOTAL KNEE ARTHROPLASTY;  Surgeon: Dorna Leitz, MD;  Location: [facility];  Service: Orthopedics;  Laterality: Right;    There were no vitals filed for this visit.   Subjective Assessment - 06/23/20 1023    Subjective Pain is 5-6/10 today. Still places I can't go with my head.    Pertinent History arthritis, breast cancer, COPD, depression, HTN    Patient Stated Goals reduce stiffness and pain in neck    Currently in Pain? Yes    Pain Score 5     Pain Location Neck    Pain Orientation Left;Right     Pain Descriptors / Indicators Aching                             [facility] Adult PT Treatment/Exercise - 06/23/20 0001      Neck Exercises: Seated   Cervical Rotation Both;5 reps    Cervical Rotation Limitations with visualization first    Lateral Flexion Right;Left;5 reps    Lateral Flexion Limitations with visualization first    Other Seated Exercise cervical extension x 5 with visualization first      Manual Therapy   Manual Therapy Joint mobilization;Soft tissue mobilization;Passive ROM;Manual Traction    Joint Mobilization in supine upper cervical rotation mobs and lateral glides    Soft tissue mobilization to ant neck muscles including ant scalenes    Passive ROM passive ROM and stretching into lateral flexion, rotation and SCM stretch    Manual Traction 20 sec x 2      Neck Exercises: Stretches   Upper Trapezius Stretch Right;Left;1 rep;60 seconds   cues to avoid trunk lean   Neck Stretch 2 reps;60 seconds   neck ext with hands on clavicles   Other Neck Stretches SCM bil 1x60 sec  PT Education - 06/23/20 1340    Education Details Worked on getting patient to visualize pain free movement of her neck in all planes prior to ROM exercises; also incuding breathing.    Person(s) Educated Patient    Methods Explanation;Demonstration;Verbal cues    Comprehension Verbalized understanding;Returned demonstration               PT Long Term Goals - 06/07/20 1508      PT LONG TERM GOAL #1   Title The patient will be independent with HEP.    Time 6    Period Weeks    Target Date 07/19/20      PT LONG TERM GOAL #2   Title The patient will reduce functional limitation per FOTO from 61% to < or equal to 42%.    Time 6    Period Weeks    Target Date 07/19/20      PT LONG TERM GOAL #3   Title The patient will improve AROM c-spine to 50 degrees bilateral rotation.    Time 6    Period Weeks    Target Date 07/19/20      PT LONG TERM  GOAL #4   Title The patient will improve AROM c-spine to 30 degrees flexion/extension.    Time 6    Period Weeks    Target Date 07/19/20      PT LONG TERM GOAL #5   Title The patient will report resting neck pain < or equal to2 /10    Baseline 7/10    Time 6    Period Weeks    Target Date 07/19/20                 Plan - 06/23/20 1341    Clinical Impression Statement Patient responded well to prolonged holds of 60 sec with stretches and to visualization of head movement. She anticipates pain before movement squinting eyes as soon as she starts to move. She demonstrated significant improvement in quality of movement and small increase in ROM as well. Still very tight in ant musculature.    Personal Factors and Comorbidities Comorbidity 2    Comorbidities HTN, arthritis    Examination-Activity Limitations Lift;Reach Overhead    PT Treatment/Interventions ADLs/Self Care Home Management;Patient/family education;Therapeutic activities;Therapeutic exercise;Electrical Stimulation;Moist Heat;Traction;Manual techniques;Dry needling;Passive range of motion;Taping    PT Next Visit Plan continue visualization of movement cues; isometrics c-spine, AROM/PROM neck, dry needling cervical muculature (splenius capitus, suboccipitals, upper trap), joint mobs to tolerance, postural opening/postural strengthening    PT Home Exercise Plan [email or web address]           Patient will benefit from skilled therapeutic intervention in order to improve the following deficits and impairments:  Pain, Impaired flexibility, Postural dysfunction, Decreased strength, Decreased range of motion, Hypomobility, Increased fascial restricitons, Increased muscle spasms  Visit Diagnosis: Cervicalgia  Muscle weakness (generalized)  Abnormal posture  Acute pain of right knee  Other abnormalities of gait and mobility  Localized edema     Problem List Patient Active Problem List   Diagnosis Date Noted  . Primary  osteoarthritis of right knee 09/03/2018  . Fracture of great toe, left, closed 08/05/2018  . 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 HYPERTENSION 06/24/2008    Madelyn Flavors PT 06/23/2020, 1:53 PM  [facility] [place], [place], [address] Phone: [phone]   Fax:  [phone]  Name: Melanie Flores MRN: [id number] Date of Birth: 1946/07/16

## 2020-06-30 ENCOUNTER — Other Ambulatory Visit: Payer: Self-pay

## 2020-06-30 ENCOUNTER — Ambulatory Visit (INDEPENDENT_AMBULATORY_CARE_PROVIDER_SITE_OTHER): Payer: Medicare PPO | Admitting: Rehabilitative and Restorative Service Providers"

## 2020-06-30 DIAGNOSIS — R293 Abnormal posture: Secondary | ICD-10-CM | POA: Diagnosis not present

## 2020-06-30 DIAGNOSIS — M6281 Muscle weakness (generalized): Secondary | ICD-10-CM

## 2020-06-30 DIAGNOSIS — M542 Cervicalgia: Secondary | ICD-10-CM | POA: Diagnosis not present

## 2020-06-30 NOTE — Patient Instructions (Signed)
Access Code: [email or web address] URL: [email or web address] Date: 06/30/2020 Prepared by: Rudell Cobb  Exercises Seated Cervical Rotation AROM - 2 x daily - 7 x weekly - 1 sets - 10 reps Seated Cervical Extension AROM - 2 x daily - 7 x weekly - 1 sets - 10 reps Seated Upper Trapezius Stretch - 2 x daily - 7 x weekly - 1 sets - 3 reps - 20 seconds hold Sternocleidomastoid Stretch - 2 x daily - 7 x weekly - 1 sets - 3 reps - 30 hold Seated Cervical Traction - 1 x daily - 7 x weekly - 1 sets - 3 reps - 20 sec hold Cervical Extension AROM with Strap - 2 x daily - 7 x weekly - 1 sets - 5 reps - 5 seconds hold Doorway Pec Stretch at 60 Elevation - 2 x daily - 7 x weekly - 1 sets - 3 reps - 30 sec hold Standing Shoulder Horizontal Abduction with Resistance - 2 x daily - 7 x weekly - 1 sets - 10 reps

## 2020-06-30 NOTE — Therapy (Addendum)
[facility] [address] [place], [place], [address] Phone: [phone]   Fax:  [phone]  Physical Therapy Treatment and On Hold Note/Discharge Summary  Patient Details  Name: Melanie Flores MRN: [id number] Date of Birth: 09/02/1945 Referring Provider (PT): Marya Landry, NP   Encounter Date: 06/30/2020   PT End of Session - 06/30/20 0937    Visit Number 7    Number of Visits 12    Date for PT Re-Evaluation 07/19/20    Authorization Type humana    Authorization Time Period 10/19-11/30/21    Authorization - Visit Number 7    Authorization - Number of Visits 12    PT Start Time 2919    PT Stop Time 1016    PT Time Calculation (min) 39 min    Activity Tolerance Patient limited by pain;Patient tolerated treatment well    Behavior During Therapy [facility] for tasks assessed/performed           Past Medical History:  Diagnosis Date  . Arthritis   . Breast cancer ([place])   . Cancer ([place])    ovarian, breast  . Chronic bronchitis ([place])   . COPD (chronic obstructive pulmonary disease) ([place])   . Depression   . Dyspnea    exertion  . Hypertension     Past Surgical History:  Procedure Laterality Date  . ABDOMINAL HYSTERECTOMY    . BREAST SURGERY    . CESAREAN SECTION    . CHOLECYSTECTOMY    . ELBOW LIGAMENT RECONSTRUCTION Right   . KNEE SURGERY    . SPLENECTOMY, TOTAL    . TONSILLECTOMY    . TOTAL KNEE ARTHROPLASTY Right 10/30/2019   Procedure: TOTAL KNEE ARTHROPLASTY;  Surgeon: Dorna Leitz, MD;  Location: [facility];  Service: Orthopedics;  Laterality: Right;    There were no vitals filed for this visit.   Subjective Assessment - 06/30/20 0938    Subjective This is going to be my last day.  I still know it is there, but I am doing much better.    Pertinent History arthritis, breast cancer, COPD, depression, HTN    Patient Stated Goals reduce stiffness and pain in neck    Currently in Pain? No/denies    Effect  of Pain on Daily Activities *Only hurts when she stops at a stop sign and turns to check for cars              [facility] PT Assessment - 06/30/20 0955      Assessment   Medical Diagnosis cervicalgia    Referring Provider (PT) Marya Landry, NP    Onset Date/Surgical Date 05/13/20      AROM   AROM Assessment Site Cervical    Cervical Flexion 40    Cervical Extension 30    Cervical - Right Rotation 62    Cervical - Left Rotation 52                         [facility] Adult PT Treatment/Exercise - 06/30/20 1003      Exercises   Exercises Neck;Shoulder      Neck Exercises: Standing   Neck Retraction 10 reps    Neck Retraction Limitations into a ball    Other Standing Exercises Overhead lifting 6 lbs for shoulder press, and bent arm abduction x 6 reps      Neck Exercises: Seated   Cervical Isometrics Right lateral flexion;Left lateral flexion    Neck Retraction  5 reps    Cervical Rotation 10 reps;Right;Left    Lateral Flexion Right;Left;5 reps    Other Seated Exercise cervical extension with SNAG using towel     Other Seated Exercise * added to HEP      Shoulder Exercises: Standing   Retraction Both;10 reps    Retraction Limitations added red theraband for horizontal abduction + retraction    Other Standing Exercises shoulder IR/ER x 10 reps      Manual Therapy   Manual Therapy Soft tissue mobilization;Joint mobilization    Joint Mobilization gentle upglides grade I    Soft tissue mobilization L paraspinal musculature STM and contract/relax at end ranges    Myofascial Release anterior chest/ pecs                  PT Education - 06/30/20 1010    Education Details HEP    Person(s) Educated Patient    Methods Explanation;Demonstration;Handout    Comprehension Verbalized understanding;Returned demonstration               PT Long Term Goals - 06/30/20 0944      PT LONG TERM GOAL #1   Title The patient will be independent with HEP.    Time 6     Period Weeks    Status Achieved      PT LONG TERM GOAL #2   Title The patient will reduce functional limitation per FOTO from 61% to < or equal to 42%.    Baseline 35% limitation    Time 6    Period Weeks    Status Achieved      PT LONG TERM GOAL #3   Title The patient will improve AROM c-spine to 50 degrees bilateral rotation.    Baseline 51 deg L and 62 deg R    Time 6    Period Weeks    Status Achieved      PT LONG TERM GOAL #4   Title The patient will improve AROM c-spine to 30 degrees flexion/extension.    Baseline 30 degrees extension, 40 deg flexion    Time 6    Period Weeks    Status Achieved      PT LONG TERM GOAL #5   Title The patient will report resting neck pain < or equal to2 /10    Baseline 0/10 at rest    Time 6    Period Weeks    Status Achieved                 Plan - 06/30/20 1012    Clinical Impression Statement The patient has met LTGs.  We progressed her HEp to address continued end range pain with extension and rotation (in L mid c-spine facet joints).  She feels that she can continue to progress to her tolerance and will f/u with PT if any needs arise in the next month.    Personal Factors and Comorbidities --    Comorbidities --    Examination-Activity Limitations --    PT Treatment/Interventions ADLs/Self Care Home Management;Patient/family education;Therapeutic activities;Therapeutic exercise;Electrical Stimulation;Moist Heat;Traction;Manual techniques;Dry needling;Passive range of motion;Taping    PT Next Visit Plan patient to continue with HEP and will call if f/u indicated.    PT Home Exercise Plan [id number]    Consulted and Agree with Plan of Care Patient           Patient will benefit from skilled therapeutic intervention in order to improve the following deficits and impairments:  Pain, Impaired flexibility, Postural dysfunction, Decreased strength, Decreased range of motion, Hypomobility, Increased fascial restricitons, Increased  muscle spasms  Visit Diagnosis: Cervicalgia  Muscle weakness (generalized)  Abnormal posture     Problem List Patient Active Problem List   Diagnosis Date Noted  . Primary osteoarthritis of right knee 09/03/2018  . Fracture of great toe, left, closed 08/05/2018  . 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 HYPERTENSION 06/24/2008   PHYSICAL THERAPY DISCHARGE SUMMARY  Visits from Start of Care: 7  Current functional level related to goals / functional outcomes: See goals above   Remaining deficits: See above   Education / Equipment: HEP, self mgmt  Plan: Patient agrees to discharge.  Patient goals were met. Patient is being discharged due to meeting the stated rehab goals.  ?????         Thank you for the referral of this patient. Rudell Cobb, MPT  [place], [place] 06/30/2020, 10:27 AM  [facility] [address] [place], [place], [address] Phone: [phone]   Fax:  [phone]  Name: Melanie Flores MRN: [id number] Date of Birth: 1946-02-15

## 2021-10-03 ENCOUNTER — Encounter: Payer: Self-pay | Admitting: *Deleted

## 2021-10-03 ENCOUNTER — Other Ambulatory Visit: Payer: Self-pay

## 2021-10-03 ENCOUNTER — Emergency Department
Admission: EM | Admit: 2021-10-03 | Discharge: 2021-10-03 | Disposition: A | Discharge status: Home / Self Care | Payer: Medicare PPO | Source: Home / Self Care

## 2021-10-03 DIAGNOSIS — R051 Acute cough: Secondary | ICD-10-CM

## 2021-10-03 DIAGNOSIS — J441 Chronic obstructive pulmonary disease with (acute) exacerbation: Secondary | ICD-10-CM | POA: Diagnosis not present

## 2021-10-03 MED ORDER — AMOXICILLIN-POT CLAVULANATE 875-125 MG PO TABS: 1.0000 | ORAL_TABLET | Freq: Two times a day (BID) | ORAL | 0 refills | Status: DC

## 2021-10-03 NOTE — ED Provider Notes (Signed)
[name]    CSN: [id number] Arrival date & time: 10/03/21  1127      History   Chief Complaint Chief Complaint  Patient presents with   Cough    HPI Melanie Flores is a 76 y.o. female.   HPI Patient states she has a cough.  She has been coughing for 4 days.  She has chest congestion.  Coughing up mucus.  Is taking Mucinex.  She states that she is not experiencing sweats chills or fever.  No chest pain.  Mild shortness of breath.  She has known COPD.  She states that as soon as she gets respiratory infection, she tries to take antibiotics to prevent it from turning into pneumonia.  She does have a primary care doctor.  She was unable to get into see them today.   Past Medical History:  Diagnosis Date   Arthritis    Breast cancer ([place])    Cancer ([place])    ovarian, breast   Chronic bronchitis ([facility])    COPD (chronic obstructive pulmonary disease) ([place])    Depression    Dyspnea    exertion   Hypertension     Patient Active Problem List   Diagnosis Date Noted   Primary osteoarthritis of right knee 09/03/2018   Fracture of great toe, left, closed 08/05/2018   LIPOMA, SKIN 06/24/2008   HYPERTENSION 06/24/2008    Past Surgical History:  Procedure Laterality Date   ABDOMINAL HYSTERECTOMY     BREAST SURGERY     CESAREAN SECTION     CHOLECYSTECTOMY     ELBOW LIGAMENT RECONSTRUCTION Right    KNEE SURGERY     SPLENECTOMY, TOTAL     TONSILLECTOMY     TOTAL KNEE ARTHROPLASTY Right 10/30/2019   Procedure: TOTAL KNEE ARTHROPLASTY;  Surgeon: Dorna Leitz, MD;  Location: [facility];  Service: Orthopedics;  Laterality: Right;    OB History   No obstetric history on file.      Home Medications    Prior to Admission medications   Medication Sig Start Date End Date Taking? Authorizing Provider  amoxicillin-clavulanate (AUGMENTIN) 875-125 MG tablet Take 1 tablet by mouth every 12 (twelve) hours. 10/03/21  Yes Raylene Everts, MD  ANORO ELLIPTA 62.5-25  MCG/INH AEPB Inhale 1 puff into the lungs daily. 10/07/19  Yes [provider]  buPROPion (WELLBUTRIN XL) 300 MG 24 hr tablet Take 300 mg by mouth daily. 08/11/19  Yes [provider]  calcium carbonate (OS-CAL) 600 MG TABS tablet Take 600 mg by mouth daily.   Yes [provider]  Cholecalciferol (VITAMIN D-3) 125 MCG (5000 UT) TABS Take 5,000 Units by mouth daily.    Yes [provider]  Coenzyme Q10 (CO Q 10) 100 MG CAPS Take 100 mg by mouth daily.   Yes [provider]  hydrochlorothiazide (MICROZIDE) 12.5 MG capsule Take 12.5 mg by mouth daily.   Yes [provider]  metoprolol tartrate (LOPRESSOR) 25 MG tablet Take 25 mg by mouth in the morning and at bedtime.    Yes [provider]  MULTIPLE VITAMIN PO Take 1 tablet by mouth daily. 50 +   Yes [provider]  MYRBETRIQ 25 MG TB24 tablet Take 25 mg by mouth daily. 09/21/19  Yes [provider]  Probiotic Product (PROBIOTIC DAILY PO) Take 1 tablet by mouth daily. 66 Billion   Yes [provider]  rosuvastatin (CRESTOR) 5 MG tablet Take 5 mg by mouth at bedtime. 09/06/19  Yes [provider]  tiZANidine (ZANAFLEX) 2 MG tablet Take 2 mg by mouth every 8 (eight) hours as needed. 10/15/19  Yes [provider]  acetaminophen (TYLENOL) 500 MG tablet Take 1,000 mg by mouth every 6 (six) hours as needed for headache.    [provider]  albuterol (PROVENTIL HFA;VENTOLIN HFA) 108 (90 Base) MCG/ACT inhaler Inhale into the lungs every 6 (six) hours as needed for wheezing or shortness of breath.    [provider]  aspirin EC 325 MG tablet Take 1 tablet (325 mg total) by mouth 2 (two) times daily after a meal. Take x 1 month post op to decrease risk of blood clots. 10/30/19   Gary Fleet, PA-C  OVER THE COUNTER MEDICATION Take 500 mg by mouth daily. Cognitive Health    [provider]  OVER THE COUNTER MEDICATION Take 1 tablet  by mouth daily. allerclear    [provider]  polyethylene glycol (MIRALAX / GLYCOLAX) 17 g packet Take 17 g by mouth daily.    [provider]    Family History History reviewed. No pertinent family history.  Social History Social History   Tobacco Use   Smoking status: Former    Packs/day: 0.50    Types: Cigarettes   Smokeless tobacco: Never  Vaping Use   Vaping Use: Former  Substance Use Topics   Alcohol use: Yes    Comment: 1/2 case per week    Drug use: No     Allergies   Levaquin [levofloxacin], Morphine and related, and Varenicline   Review of Systems Review of Systems See HPI  Physical Exam Triage Vital Signs ED Triage Vitals  Enc Vitals Group     BP 10/03/21 1142 (!) 174/91     Pulse Rate 10/03/21 1142 65     Resp 10/03/21 1142 18     Temp 10/03/21 1142 98.3 F (36.8 C)     Temp Source 10/03/21 1142 Oral     SpO2 10/03/21 1142 94 %     Weight --      Height --      Head Circumference --      Peak Flow --      Pain Score 10/03/21 1143 0     Pain Loc --      Pain Edu? --      Excl. in [place]? --    No data found.  Updated Vital Signs BP (!) 174/91 (BP Location: Left Arm)    Pulse 65    Temp 98.3 F (36.8 C) (Oral)    Resp 18    SpO2 94%      Physical Exam Constitutional:      General: She is not in acute distress.    Appearance: She is well-developed.  HENT:     Head: Normocephalic and atraumatic.     Mouth/Throat:     Comments: Mask in place Eyes:     Conjunctiva/sclera: Conjunctivae normal.     Pupils: Pupils are equal, round, and reactive to light.  Cardiovascular:     Rate and Rhythm: Normal rate.     Heart sounds: Normal heart sounds.  Pulmonary:     Effort: Pulmonary effort is normal. No respiratory distress.     Breath sounds: Wheezing and rhonchi present.     Comments: Scattered anterior wheeze and rhonchi.  No rales Abdominal:     General: There is no distension.     Palpations: Abdomen is soft.   Musculoskeletal:  General: Normal range of motion.     Cervical back: Normal range of motion.  Skin:    General: Skin is warm and dry.  Neurological:     Mental Status: She is alert.  Psychiatric:        Mood and Affect: Mood normal.        Behavior: Behavior normal.     UC Treatments / Results  Labs (all labs ordered are listed, but only abnormal results are displayed) Labs Reviewed - No data to display  EKG   Radiology No results found.  Procedures Procedures (including critical care time)  Medications Ordered in UC Medications - No data to display  Initial Impression / Assessment and Plan / UC Course  I have reviewed the triage vital signs and the nursing notes.  Pertinent labs & imaging results that were available during my care of the patient were reviewed by me and considered in my medical decision making (see chart for details).     Final Clinical Impressions(s) / UC Diagnoses   Final diagnoses:  Acute cough  COPD exacerbation ([place])     Discharge Instructions      Take Augmentin 2 times a day for weight Increase your water intake Run a humidifier if you have 1 continue all of your COPD inhalers Follow-up with your primary care doctor    ED Prescriptions     Medication Sig Dispense Auth. Provider   amoxicillin-clavulanate (AUGMENTIN) 875-125 MG tablet Take 1 tablet by mouth every 12 (twelve) hours. 14 tablet Raylene Everts, MD      I have reviewed the PDMP during this encounter.   Raylene Everts, MD 10/03/21 [phone]

## 2021-10-03 NOTE — ED Triage Notes (Signed)
Patients c/o 5 days of coughing and congestion. Taking mucinex. Afebrile. SOB with exertion. Former smoker.

## 2021-10-03 NOTE — Discharge Instructions (Signed)
Take Augmentin 2 times a day for weight Increase your water intake Run a humidifier if you have 1 continue all of your COPD inhalers Follow-up with your primary care doctor

## 2021-12-17 IMAGING — CR DG CHEST 2V
2 series · 2 of 2 positions shown · non-contrast
Comparison: 08/11/2019

CLINICAL DATA: Preop evaluation for upcoming knee replacement

EXAM:
CHEST - 2 VIEW

[w chest pa]
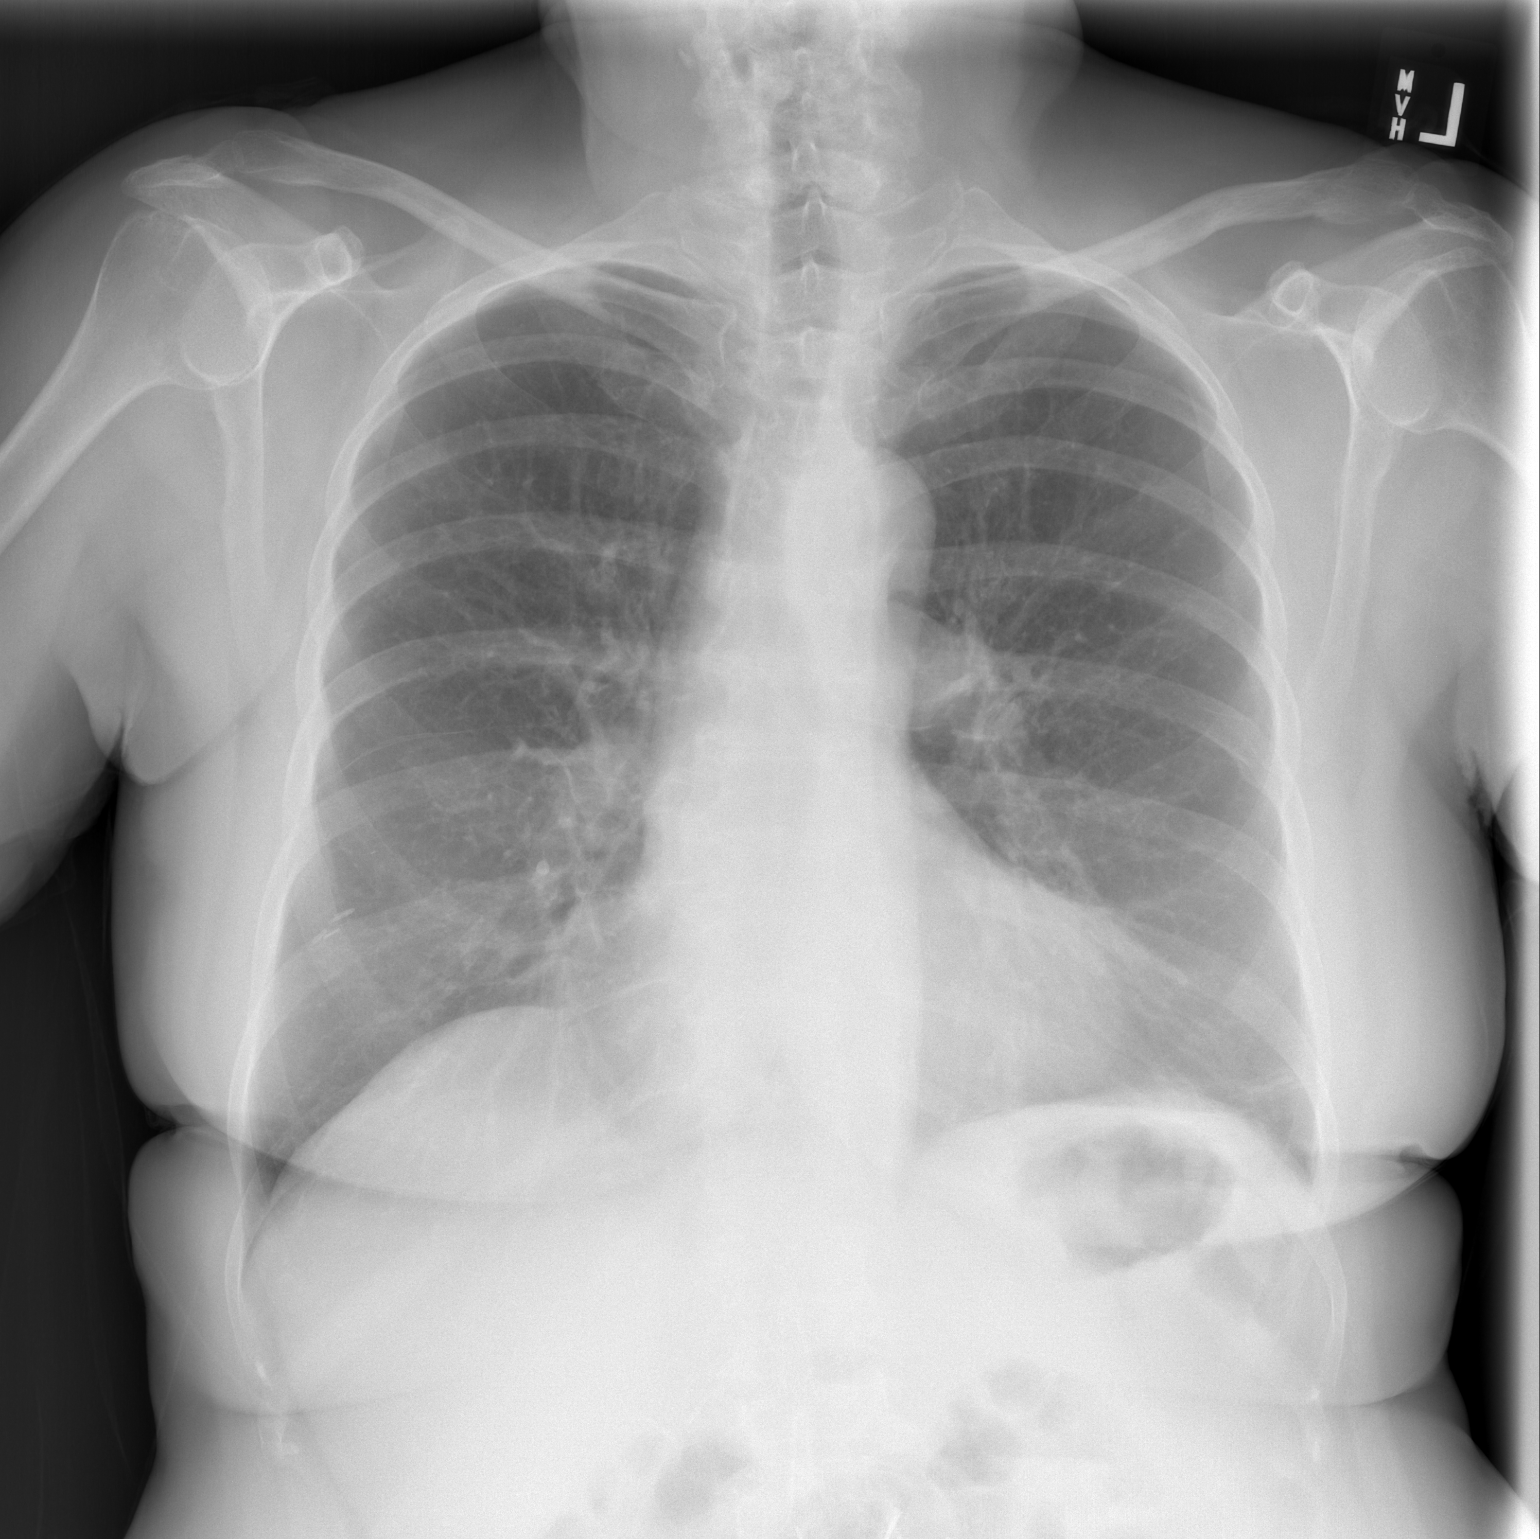

[w chest lat]
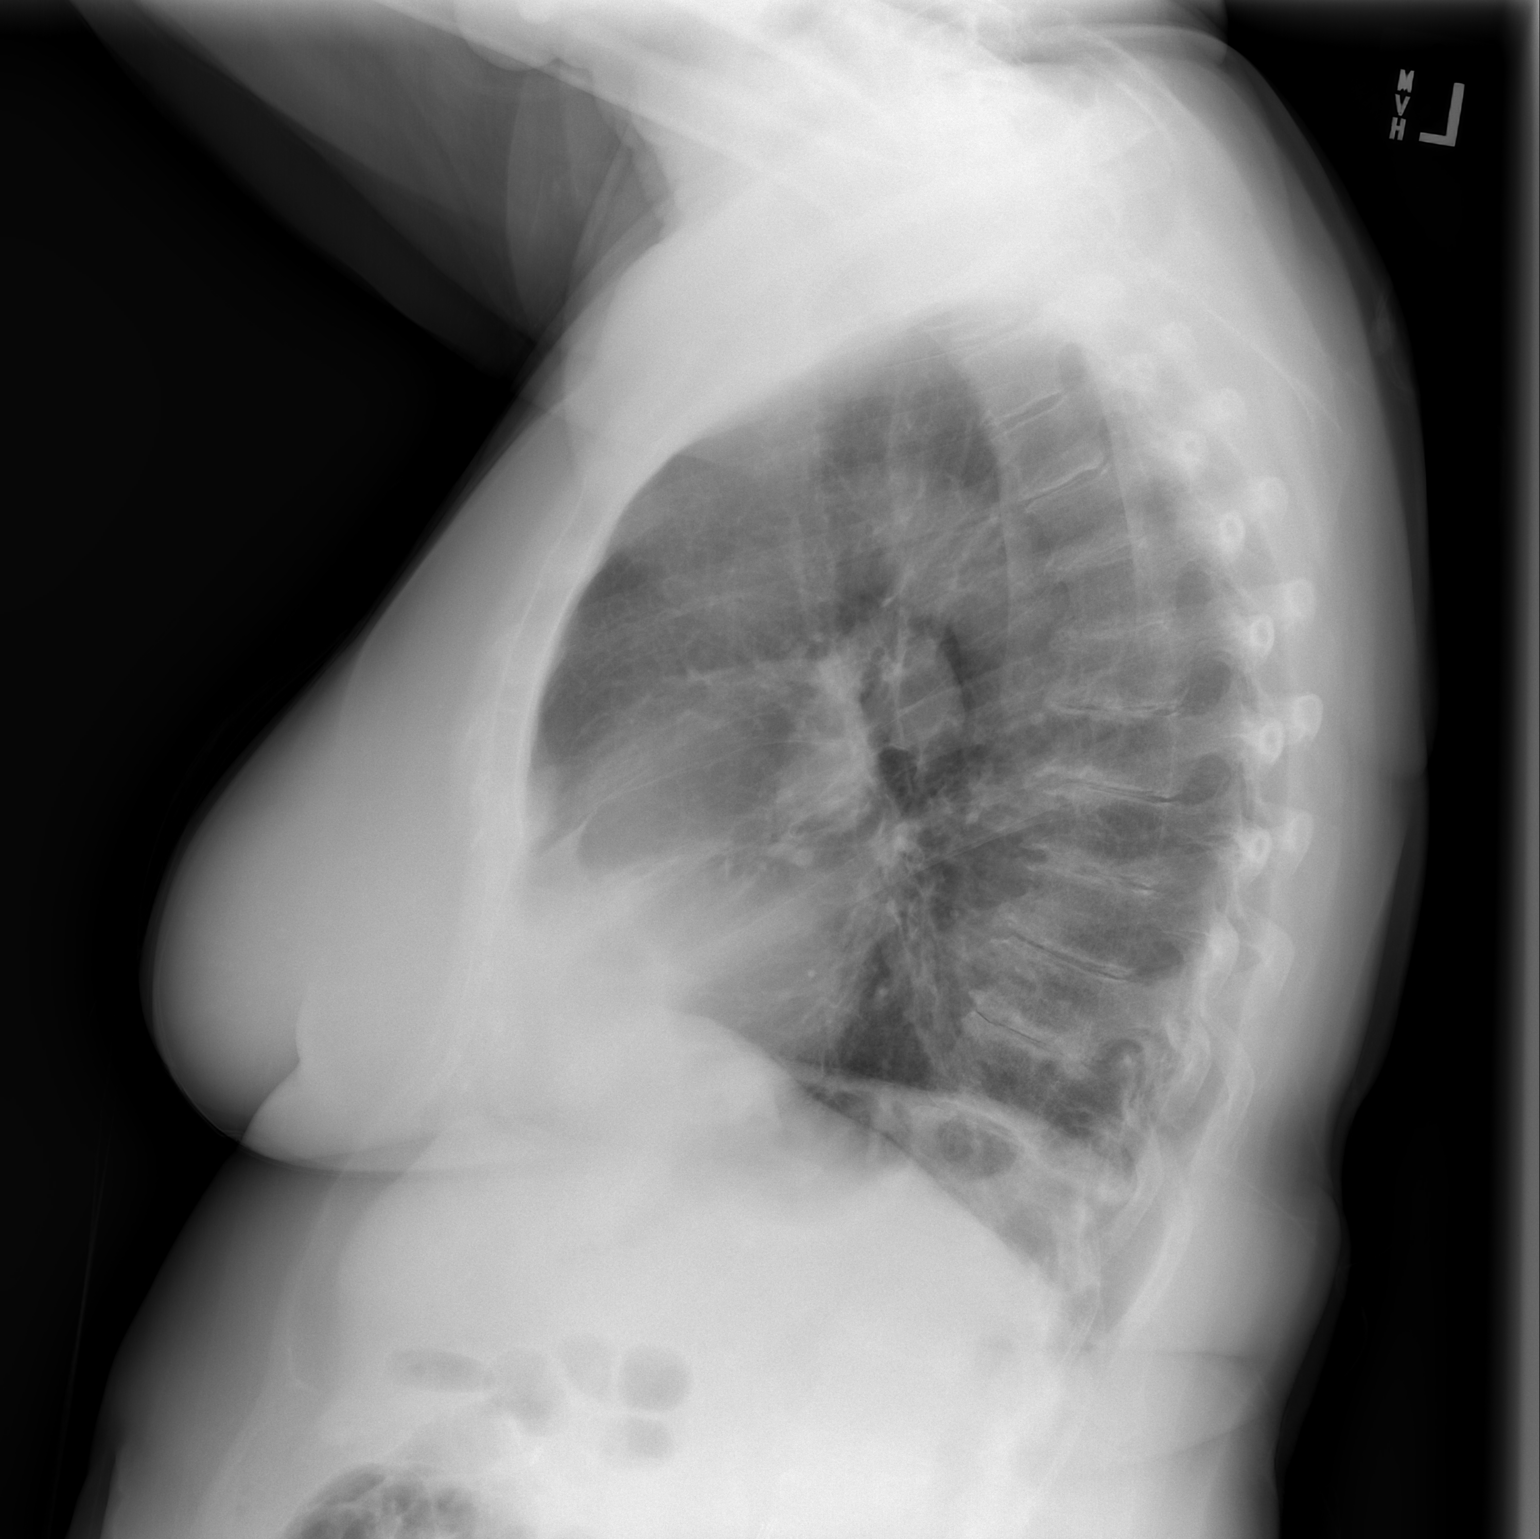

[2 of 2 positions shown; findings below may reference images not displayed]

FINDINGS: Cardiac shadow is within normal limits. The lungs are clear
bilaterally. Postsurgical changes are noted right breast.
Degenerative changes of the thoracic spine are again seen.
IMPRESSION: No acute abnormality noted.

## 2023-08-10 ENCOUNTER — Ambulatory Visit
Admission: EM | Admit: 2023-08-10 | Discharge: 2023-08-10 | Disposition: A | Discharge status: Home / Self Care | Payer: Medicare PPO | Attending: Family Medicine | Admitting: Family Medicine

## 2023-08-10 DIAGNOSIS — N3090 Cystitis, unspecified without hematuria: Secondary | ICD-10-CM | POA: Insufficient documentation

## 2023-08-10 DIAGNOSIS — Z20822 Contact with and (suspected) exposure to covid-19: Secondary | ICD-10-CM | POA: Diagnosis not present

## 2023-08-10 DIAGNOSIS — J449 Chronic obstructive pulmonary disease, unspecified: Secondary | ICD-10-CM | POA: Insufficient documentation

## 2023-08-10 DIAGNOSIS — C569 Malignant neoplasm of unspecified ovary: Secondary | ICD-10-CM | POA: Insufficient documentation

## 2023-08-10 DIAGNOSIS — Z87891 Personal history of nicotine dependence: Secondary | ICD-10-CM | POA: Insufficient documentation

## 2023-08-10 DIAGNOSIS — I10 Essential (primary) hypertension: Secondary | ICD-10-CM | POA: Insufficient documentation

## 2023-08-10 LAB — POCT URINALYSIS DIP (MANUAL ENTRY)
Glucose, UA: 100 mg/dL — AB
Nitrite, UA: POSITIVE — AB
Protein Ur, POC: >=300 mg/dL — AB
Spec Grav, UA: 1.025 (ref 1.010–1.025)
Urobilinogen, UA: 2 U/dL — AB
pH, UA: 7 (ref 5.0–8.0)

## 2023-08-10 LAB — POC SARS CORONAVIRUS 2 AG -  ED: SARS Coronavirus 2 Ag: NEGATIVE

## 2023-08-10 MED ORDER — NITROFURANTOIN MONOHYD MACRO 100 MG PO CAPS: 100.0000 mg | ORAL_CAPSULE | Freq: Two times a day (BID) | ORAL | 0 refills | Status: DC

## 2023-08-10 NOTE — ED Triage Notes (Signed)
Pt states that she also has a cough. X1 week

## 2023-08-10 NOTE — ED Provider Notes (Signed)
[facility]    CSN: [id number] Arrival date & time: 08/10/23  0831      History   Chief Complaint Chief Complaint  Patient presents with   Urinary Frequency    Urinary frequency and urinary urgency x2 days   Cough    Cough. X1 week    HPI Melanie Flores is a 77 y.o. female.   HPI  Patient has a history of breast ovarian cancer, hypertension, COPD.  She is because she feels like she has a urinary tract infection.  Dysuria and frequency.  This is just been going on for 2 days.  No fever or chills.  No nausea or vomiting.  No flank pain Also mentions that she has been exposed to COVID.  She is a smoker and has a longstanding cough from chronic bronchitis.  She desires COVID testing  Past Medical History:  Diagnosis Date   Arthritis    Breast cancer ([facility])    Cancer ([facility])    ovarian, breast   Chronic bronchitis ([facility])    COPD (chronic obstructive pulmonary disease) ([facility])    Depression    Dyspnea    exertion   Hypertension     Patient Active Problem List   Diagnosis Date Noted   Primary osteoarthritis of right knee 09/03/2018   Fracture of great toe, left, closed 08/05/2018   LIPOMA, SKIN 06/24/2008   Essential hypertension 06/24/2008    Past Surgical History:  Procedure Laterality Date   ABDOMINAL HYSTERECTOMY     BREAST SURGERY     CESAREAN SECTION     CHOLECYSTECTOMY     ELBOW LIGAMENT RECONSTRUCTION Right    KNEE SURGERY     SPLENECTOMY, TOTAL     TONSILLECTOMY     TOTAL KNEE ARTHROPLASTY Right 10/30/2019   Procedure: TOTAL KNEE ARTHROPLASTY;  Surgeon: Jodi Geralds, MD;  [facility];  Service: Orthopedics;  Laterality: Right;    OB History   No obstetric history on file.      Home Medications    Prior to Admission medications   Medication Sig Start Date End Date Taking? Authorizing Provider  acetaminophen (TYLENOL) 500 MG tablet Take 1,000 mg by mouth every 6 (six) hours as needed for headache.   Yes [provider]   ANORO ELLIPTA 62.5-25 MCG/INH AEPB Inhale 1 puff into the lungs daily. 10/07/19  Yes [provider]  buPROPion (WELLBUTRIN XL) 300 MG 24 hr tablet Take 300 mg by mouth daily. 08/11/19  Yes [provider]  calcium carbonate (OS-CAL) 600 MG TABS tablet Take 600 mg by mouth daily.   Yes [provider]  Cholecalciferol (VITAMIN D-3) 125 MCG (5000 UT) TABS Take 5,000 Units by mouth daily.    Yes [provider]  clobetasol cream (TEMOVATE) 0.05 % Apply topically. 04/25/23  Yes [provider]  Fluticasone-Umeclidin-Vilant (TRELEGY ELLIPTA) 100-62.5-25 MCG/ACT AEPB Inhale into the lungs. 03/21/21  Yes [provider]  hydrochlorothiazide (MICROZIDE) 12.5 MG capsule Take 12.5 mg by mouth daily.   Yes [provider]  hyoscyamine (LEVSIN) 0.125 MG tablet Take by mouth.   Yes [provider]  LORazepam (ATIVAN) 0.5 MG tablet 1 tablet Orally Once a day for 30 days As needed 01/12/21  Yes [provider]  metoprolol tartrate (LOPRESSOR) 25 MG tablet Take 25 mg by mouth in the morning and at bedtime.    Yes [provider]  MULTIPLE VITAMIN PO Take 1 tablet by mouth daily. 50 +   Yes  [provider]  MYRBETRIQ 25 MG TB24 tablet Take 25 mg by mouth daily. 09/21/19  Yes [provider]  nitrofurantoin, macrocrystal-monohydrate, (MACROBID) 100 MG capsule Take 1 capsule (100 mg total) by mouth 2 (two) times daily. 08/10/23  Yes Eustace Moore, MD  pantoprazole (PROTONIX) 40 MG tablet 1 tablet 1/2 to 1 hour before morning meal Orally Once a day 11/07/20  Yes [provider]  pregabalin (LYRICA) 50 MG capsule 1 capsule Orally twice a day for 30 days 07/30/23 08/29/23 Yes [provider]  Probiotic Product (PROBIOTIC DAILY PO) Take 1 tablet by mouth daily. 15 Billion   Yes [provider]  rosuvastatin (CRESTOR) 5 MG tablet Take 5 mg by mouth at bedtime. 09/06/19  Yes [provider]  STELARA 90 MG/ML SOSY injection Inject into the skin. 03/06/23  Yes [provider]  temazepam (RESTORIL) 30 MG capsule 1 capsule at bedtime as needed Orally Once a day 07/23/23  Yes [provider]    Family History Family History  Problem Relation Age of Onset   Heart failure Mother    Lung cancer Father     Social History Social History   Tobacco Use   Smoking status: Former    Current packs/day: 0.50    Types: Cigarettes   Smokeless tobacco: Never  Vaping Use   Vaping status: Former  Substance Use Topics   Alcohol use: Not Currently    Comment: 1/2 case per week    Drug use: No     Allergies   Levaquin [levofloxacin], Morphine and codeine, and Varenicline   Review of Systems Review of Systems See HPI  Physical Exam Triage Vital Signs  BP 133/79 (BP Location: Left Arm)   Pulse 65   Temp (!) 97.5 F (36.4 C) (Oral)   Resp 17   Ht 5\' 5"  (1.651 m)   Wt 74.8 kg   SpO2 95%   BMI 27.46 kg/m      Physical Exam Constitutional:      General: She is not in acute distress.    Appearance: She is well-developed. She is ill-appearing.  HENT:     Head: Normocephalic and atraumatic.  Eyes:     Conjunctiva/sclera: Conjunctivae normal.     Pupils: Pupils are equal, round, and reactive to light.  Cardiovascular:     Rate and Rhythm: Normal rate.  Pulmonary:     Effort: Pulmonary effort is normal. No respiratory distress.     Breath sounds: Rhonchi present.  Abdominal:     General: There is no distension.     Palpations: Abdomen is soft.     Tenderness: There is no right CVA tenderness or left CVA tenderness.  Musculoskeletal:        General: Normal range of motion.     Cervical back: Normal range of motion.  Skin:    General: Skin is warm and dry.  Neurological:     Mental Status: She is alert.      UC Treatments / Results  Labs (all labs ordered are listed, but only abnormal results are displayed) Labs Reviewed   POCT URINALYSIS DIP (MANUAL ENTRY) - Abnormal; Notable for the following components:      Result Value   Color, UA orange (*)    Glucose, UA =100 (*)    Bilirubin, UA small (*)    Ketones, POC UA trace (5) (*)    Blood, UA moderate (*)    Protein Ur, POC >=300 (*)  Urobilinogen, UA 2.0 (*)    Nitrite, UA Positive (*)    Leukocytes, UA Small (1+) (*)    All other components within normal limits  POC SARS CORONAVIRUS 2 AG -  ED - Normal  URINE CULTURE    EKG   Radiology No results found.  Procedures Procedures (including critical care time)  Medications Ordered in UC Medications - No data to display  Initial Impression / Assessment and Plan / UC Course  I have reviewed the triage vital signs and the nursing notes.  Pertinent labs & imaging results that were available during my care of the patient were reviewed by me and considered in my medical decision making (see chart for details).     Urinalysis is positive.  Urine is stained from Azo use.  Will send culture and cover with antibiotics.  COVID test is negative Final Clinical Impressions(s) / UC Diagnoses   Final diagnoses:  Cystitis  Close exposure to COVID-19 virus     Discharge Instructions      Take the nitrofurantoin antibiotic 2 times a day for 5 days Make sure you are drinking lots of water  COVID test is negative  The urine sample has been sent to the laboratory for culture.  You will be called if any change in antibiotics is needed      ED Prescriptions     Medication Sig Dispense Auth. Provider   nitrofurantoin, macrocrystal-monohydrate, (MACROBID) 100 MG capsule Take 1 capsule (100 mg total) by mouth 2 (two) times daily. 10 capsule Eustace Moore, MD      PDMP not reviewed this encounter.   Eustace Moore, MD 08/10/23 [phone]

## 2023-08-10 NOTE — Discharge Instructions (Addendum)
Take the nitrofurantoin antibiotic 2 times a day for 5 days Make sure you are drinking lots of water  COVID test is negative  The urine sample has been sent to the laboratory for culture.  You will be called if any change in antibiotics is needed

## 2023-08-10 NOTE — ED Triage Notes (Signed)
Pt states that she has some urinary frequency and urinary urgency. X2 days

## 2023-08-12 LAB — URINE CULTURE
Culture: >=100000 — AB
Special Requests: NORMAL

## 2024-04-13 ENCOUNTER — Ambulatory Visit: Payer: Self-pay | Admitting: Nurse Practitioner

## 2024-04-13 ENCOUNTER — Ambulatory Visit (INDEPENDENT_AMBULATORY_CARE_PROVIDER_SITE_OTHER)

## 2024-04-13 ENCOUNTER — Ambulatory Visit
Admission: EM | Admit: 2024-04-13 | Discharge: 2024-04-13 | Disposition: A | Discharge status: Home / Self Care | Attending: Nurse Practitioner | Admitting: Nurse Practitioner

## 2024-04-13 DIAGNOSIS — R051 Acute cough: Secondary | ICD-10-CM

## 2024-04-13 DIAGNOSIS — J441 Chronic obstructive pulmonary disease with (acute) exacerbation: Secondary | ICD-10-CM

## 2024-04-13 DIAGNOSIS — J069 Acute upper respiratory infection, unspecified: Secondary | ICD-10-CM

## 2024-04-13 MED ORDER — BENZONATATE 200 MG PO CAPS
200.0000 mg | ORAL_CAPSULE | Freq: Three times a day (TID) | ORAL | 0 refills | Status: DC | PRN
Start: 2024-04-13 — End: 2024-07-15

## 2024-04-13 MED ORDER — MUCINEX DM MAXIMUM STRENGTH 60-1200 MG PO TB12: 1.0000 | ORAL_TABLET | Freq: Two times a day (BID) | ORAL | 0 refills | Status: DC

## 2024-04-13 MED ORDER — IPRATROPIUM-ALBUTEROL 0.5-2.5 (3) MG/3ML IN SOLN
3.0000 mL | Freq: Once | RESPIRATORY_TRACT | Status: AC
  Administered 2024-04-13: 3 mL via RESPIRATORY_TRACT

## 2024-04-13 MED ORDER — PREDNISONE 10 MG (21) PO TBPK: ORAL_TABLET | Freq: Every day | ORAL | 0 refills | Status: DC

## 2024-04-13 NOTE — ED Provider Notes (Signed)
 [facility]    CSN: [id number] Arrival date & time: 04/13/24  1029      History   Chief Complaint Chief Complaint  Patient presents with   Cough    HPI Melanie Flores is a 78 y.o. female.   Discussed the use of AI scribe software for clinical note transcription with the patient, who gave verbal consent to proceed.   Patient with history of COPD presents with cough, chest tightness, and shortness of breath for approximately 1 week, following a cold-like illness that began 2 weeks ago.  The patient reports that about 2 weeks ago, she experienced symptoms similar to a cold, including a runny nose and other symptoms. These initial symptoms resolved, and the patient felt better for a few days. However, approximately 1 week ago (last Thursday), they began experiencing chest tightness. She is experiencing wheezing and shortness of breath along with the chest tightness. The patient reports being able to produce sputum when coughing.  The patient denies any current cold symptoms such as nasal congestion, runny nose, sore throat, fever, or chills. She reports having body aches, but state this is a chronic condition. The patient also mentions having a mild headache, which they describe as mild, for which she took ibuprofen  this morning. She denies any nausea, vomiting, or diarrhea. The patient reports that the cough is interfering with her sleep.  The patient uses Trelegy daily for COPD management and has an albuterol  rescue inhaler, though haven't been using it despite acknowledging that she probably could have a few times. She does not have a nebulizer at home. The patient reports that her oxygen  level was pretty good today, but yesterday it was around 92%.   The following portions of the patient's history were reviewed and updated as appropriate: allergies, current medications, past family history, past medical history, past social history, past surgical history, and problem  list.     Past Medical History:  Diagnosis Date   Arthritis    Breast cancer (HCC)    Cancer (HCC)    ovarian, breast   Chronic bronchitis (HCC)    COPD (chronic obstructive pulmonary disease) (HCC)    Depression    Dyspnea    exertion   Hypertension     Patient Active Problem List   Diagnosis Date Noted   Primary osteoarthritis of right knee 09/03/2018   Fracture of great toe, left, closed 08/05/2018   LIPOMA, SKIN 06/24/2008   Essential hypertension 06/24/2008    Past Surgical History:  Procedure Laterality Date   ABDOMINAL HYSTERECTOMY     BREAST SURGERY     CESAREAN SECTION     CHOLECYSTECTOMY     ELBOW LIGAMENT RECONSTRUCTION Right    KNEE SURGERY     SPLENECTOMY, TOTAL     TONSILLECTOMY     TOTAL KNEE ARTHROPLASTY Right 10/30/2019   Procedure: TOTAL KNEE ARTHROPLASTY;  Surgeon: Yvone Rush, MD;  Location: [facility];  Service: Orthopedics;  Laterality: Right;    OB History   No obstetric history on file.      Home Medications    Prior to Admission medications   Medication Sig Start Date End Date Taking? Authorizing Provider  benzonatate  (TESSALON ) 200 MG capsule Take 1 capsule (200 mg total) by mouth 3 (three) times daily as needed for cough. 04/13/24  Yes Iola Lukes, FNP  Dextromethorphan-guaiFENesin  (MUCINEX  DM MAXIMUM STRENGTH) 60-1200 MG TB12 Take 1 tablet by mouth 2 (two) times daily. 04/13/24  Yes Iola Lukes, FNP  predniSONE  (  STERAPRED UNI-PAK 21 TAB) 10 MG (21) TBPK tablet Take by mouth daily. Take 6 tabs by mouth daily  for 2 days, then 5 tabs for 2 days, then 4 tabs for 2 days, then 3 tabs for 2 days, 2 tabs for 2 days, then 1 tab by mouth daily for 2 days 04/13/24  Yes Monnie Gudgel, Lucie, FNP  acetaminophen  (TYLENOL ) 500 MG tablet Take 1,000 mg by mouth every 6 (six) hours as needed for headache.    [provider]  ANORO ELLIPTA  62.5-25 MCG/INH AEPB Inhale 1 puff into the lungs daily. 10/07/19   [provider]  buPROPion   (WELLBUTRIN  XL) 300 MG 24 hr tablet Take 300 mg by mouth daily. 08/11/19   [provider]  calcium carbonate (OS-CAL) 600 MG TABS tablet Take 600 mg by mouth daily.    [provider]  Cholecalciferol (VITAMIN D-3) 125 MCG (5000 UT) TABS Take 5,000 Units by mouth daily.     [provider]  clobetasol cream (TEMOVATE) 0.05 % Apply topically. 04/25/23   [provider]  Fluticasone-Umeclidin-Vilant (TRELEGY ELLIPTA) 100-62.5-25 MCG/ACT AEPB Inhale into the lungs. 03/21/21   [provider]  hydrochlorothiazide  (MICROZIDE ) 12.5 MG capsule Take 12.5 mg by mouth daily.    [provider]  hyoscyamine (LEVSIN) 0.125 MG tablet Take by mouth.    [provider]  LORazepam (ATIVAN) 0.5 MG tablet 1 tablet Orally Once a day for 30 days As needed 01/12/21   [provider]  metoprolol  tartrate (LOPRESSOR ) 25 MG tablet Take 25 mg by mouth in the morning and at bedtime.     [provider]  MULTIPLE VITAMIN PO Take 1 tablet by mouth daily. 50 +    [provider]  MYRBETRIQ  25 MG TB24 tablet Take 25 mg by mouth daily. 09/21/19   [provider]  pantoprazole (PROTONIX) 40 MG tablet 1 tablet 1/2 to 1 hour before morning meal Orally Once a day 11/07/20   [provider]  pregabalin (LYRICA) 50 MG capsule 1 capsule Orally twice a day for 30 days 07/30/23 08/29/23  [provider]  Probiotic Product (PROBIOTIC DAILY PO) Take 1 tablet by mouth daily. 15 Billion    [provider]  rosuvastatin (CRESTOR) 5 MG tablet Take 5 mg by mouth at bedtime. 09/06/19   [provider]  STELARA 90 MG/ML SOSY injection Inject into the skin. 03/06/23   [provider]  temazepam (RESTORIL) 30 MG capsule 1 capsule at bedtime as needed Orally Once a day 07/23/23   [provider]    Family History Family History  Problem Relation Age of Onset   Heart failure Mother    Lung cancer Father      Social History Social History   Tobacco Use   Smoking status: Former    Current packs/day: 0.50    Types: Cigarettes   Smokeless tobacco: Never  Vaping Use   Vaping status: Former  Substance Use Topics   Alcohol use: Not Currently    Comment: 1/2 case per week    Drug use: No     Allergies   Levaquin [levofloxacin], Morphine and codeine, and Varenicline   Review of Systems Review of Systems  Constitutional:  Negative for fever.  HENT:  Negative for congestion, rhinorrhea and sore throat.   Respiratory:  Positive for cough, chest tightness, shortness of breath and wheezing.   Cardiovascular:  Negative for chest pain, palpitations and leg swelling.  Gastrointestinal:  Negative for diarrhea,  nausea and vomiting.  Musculoskeletal:  Negative for myalgias.  Neurological:  Positive for headaches.  All other systems reviewed and are negative.    Physical Exam Triage Vital Signs ED Triage Vitals  Encounter Vitals Group     BP 04/13/24 1102 128/80     Girls Systolic BP Percentile --      Girls Diastolic BP Percentile --      Boys Systolic BP Percentile --      Boys Diastolic BP Percentile --      Pulse Rate 04/13/24 1102 75     Resp 04/13/24 1102 17     Temp 04/13/24 1102 98.2 F (36.8 C)     Temp Source 04/13/24 1102 Oral     SpO2 04/13/24 1102 94 %     Weight --      Height --      Head Circumference --      Peak Flow --      Pain Score 04/13/24 1104 0     Pain Loc --      Pain Education --      Exclude from Growth Chart --    No data found.  Updated Vital Signs BP 128/80 (BP Location: Right Arm)   Pulse 75   Temp 98.2 F (36.8 C) (Oral)   Resp 17   SpO2 94%   Visual Acuity Right Eye Distance:   Left Eye Distance:   Bilateral Distance:    Right Eye Near:   Left Eye Near:    Bilateral Near:     Physical Exam Vitals reviewed.  Constitutional:      General: She is awake. She is not in acute distress.    Appearance: Normal appearance. She is  well-developed. She is not ill-appearing, toxic-appearing or diaphoretic.  HENT:     Head: Normocephalic.     Right Ear: Hearing normal.     Left Ear: Hearing normal.     Nose: Nose normal.     Mouth/Throat:     Mouth: Mucous membranes are moist.  Eyes:     General: Vision grossly intact.     Conjunctiva/sclera: Conjunctivae normal.  Cardiovascular:     Rate and Rhythm: Normal rate and regular rhythm.     Heart sounds: Normal heart sounds.  Pulmonary:     Effort: Pulmonary effort is normal.     Breath sounds: Normal breath sounds and air entry. No decreased air movement. No wheezing or rhonchi.     Comments: Respirations even and unlabored  Musculoskeletal:        General: Normal range of motion.     Cervical back: Full passive range of motion without pain, normal range of motion and neck supple.     Right lower leg: No edema.     Left lower leg: No edema.  Skin:    General: Skin is warm and dry.  Neurological:     General: No focal deficit present.     Mental Status: She is alert and oriented to person, place, and time.  Psychiatric:        Speech: Speech normal.        Behavior: Behavior is cooperative.      UC Treatments / Results  Labs (all labs ordered are listed, but only abnormal results are displayed) Labs Reviewed - No data to display  EKG   Radiology DG Chest 2 View Result Date: 04/13/2024 CLINICAL DATA:  Cough EXAM: CHEST - 2 VIEW COMPARISON:  10/26/2019 FINDINGS: Heart and  mediastinal contours are within normal limits. No focal opacities or effusions. No acute bony abnormality. Aortic atherosclerosis. IMPRESSION: No active cardiopulmonary disease. Electronically Signed   By: Franky Crease M.D.   On: 04/13/2024 14:14    Procedures Procedures (including critical care time)  Medications Ordered in UC Medications  ipratropium-albuterol  (DUONEB) 0.5-2.5 (3) MG/3ML nebulizer solution 3 mL (3 mLs Nebulization Given 04/13/24 1253)    Initial Impression /  Assessment and Plan / UC Course  I have reviewed the triage vital signs and the nursing notes.  Pertinent labs & imaging results that were available during my care of the patient were reviewed by me and considered in my medical decision making (see chart for details).    Patient with known COPD presents with a two-week history of respiratory symptoms. Initial cold-like illness resolved, but she developed chest tightness approximately four days ago along with wheezing, shortness of breath, and a persistent productive cough. Home oxygen  saturation was noted at 92% yesterday. She denies fever, chills, chest pain, or palpitations. Exam reveals good air movement but a persistent cough. Presentation is consistent with a COPD exacerbation likely triggered by an upper respiratory infection.  Treatment included administration of a nebulizer treatment in clinic for chest tightness and initiation of a prednisone  taper. Chest X-ray was ordered to rule out pneumonia or other acute complications, with plan to notify the patient if results are abnormal and prescribe antibiotics if indicated. Supportive therapy includes Mucinex  DM twice daily and Tessalon  Perles as needed for cough, increased fluid intake to help thin secretions, and use of a humidifier at night. Patient was instructed to follow up with her PCP if symptoms do not improve and to seek emergency care for worsening shortness of breath, chest pain, hypoxia, or other concerning symptoms.  Today's evaluation has revealed no signs of a dangerous process. Discussed diagnosis with patient and/or guardian. Patient and/or guardian aware of their diagnosis, possible red flag symptoms to watch out for and need for close follow up. Patient and/or guardian understands verbal and written discharge instructions. Patient and/or guardian comfortable with plan and disposition.  Patient and/or guardian has a clear mental status at this time, good insight into illness (after  discussion and teaching) and has clear judgment to make decisions regarding their care  Documentation was completed with the aid of voice recognition software. Transcription may contain typographical errors.  Final Clinical Impressions(s) / UC Diagnoses   Final diagnoses:  Acute cough  Upper respiratory tract infection, unspecified type  COPD with acute exacerbation [facility])     Discharge Instructions      You were seen today for a flare-up of your COPD. Your symptoms of chest tightness, shortness of breath, wheezing, and persistent cough are consistent with a COPD exacerbation, likely triggered by a recent upper respiratory infection. You were given a nebulizer treatment in the clinic today to help with your breathing. A chest X-ray was ordered to check for pneumonia or other complications. If the results show pneumonia or another abnormality, you will receive a call and an antibiotic prescription. If you do not receive a call, your X-ray was normal.  You have been prescribed a prednisone  taper to reduce inflammation, Mucinex  DM twice daily to thin mucus, and Tessalon  Perles as needed for cough. Take these medications exactly as directed. You should also increase your fluid intake to keep mucus thinner and easier to clear, and consider using a humidifier by your bedside at night to make breathing more comfortable. Rest and avoid  strenuous activity while your lungs recover.  Please follow up with your primary care provider if you do not notice improvement in your symptoms after completing the prescribed medications. Go to the emergency department right away if you develop worsening shortness of breath, severe chest pain, confusion, bluish lips or fingertips, or if your oxygen  saturation drops significantly below your usual baseline.     ED Prescriptions     Medication Sig Dispense Auth. Provider   predniSONE  (STERAPRED UNI-PAK 21 TAB) 10 MG (21) TBPK tablet Take by mouth daily. Take 6 tabs  by mouth daily  for 2 days, then 5 tabs for 2 days, then 4 tabs for 2 days, then 3 tabs for 2 days, 2 tabs for 2 days, then 1 tab by mouth daily for 2 days 42 tablet Christifer Chapdelaine, [place], FNP   Dextromethorphan-guaiFENesin  (MUCINEX  DM MAXIMUM STRENGTH) 60-1200 MG TB12 Take 1 tablet by mouth 2 (two) times daily. 20 tablet Iola Lukes, FNP   benzonatate  (TESSALON ) 200 MG capsule Take 1 capsule (200 mg total) by mouth 3 (three) times daily as needed for cough. 30 capsule Iola Lukes, FNP      PDMP not reviewed this encounter.   Iola Red Jacket, [place] 04/15/24 [phone]

## 2024-04-13 NOTE — Discharge Instructions (Addendum)
 You were seen today for a flare-up of your COPD. Your symptoms of chest tightness, shortness of breath, wheezing, and persistent cough are consistent with a COPD exacerbation, likely triggered by a recent upper respiratory infection. You were given a nebulizer treatment in the clinic today to help with your breathing. A chest X-ray was ordered to check for pneumonia or other complications. If the results show pneumonia or another abnormality, you will receive a call and an antibiotic prescription. If you do not receive a call, your X-ray was normal.  You have been prescribed a prednisone  taper to reduce inflammation, Mucinex  DM twice daily to thin mucus, and Tessalon  Perles as needed for cough. Take these medications exactly as directed. You should also increase your fluid intake to keep mucus thinner and easier to clear, and consider using a humidifier by your bedside at night to make breathing more comfortable. Rest and avoid strenuous activity while your lungs recover.  Please follow up with your primary care provider if you do not notice improvement in your symptoms after completing the prescribed medications. Go to the emergency department right away if you develop worsening shortness of breath, severe chest pain, confusion, bluish lips or fingertips, or if your oxygen  saturation drops significantly below your usual baseline.

## 2024-04-13 NOTE — ED Triage Notes (Signed)
 Pt c/o cough x 2 weeks. No known fever. Robitussin prn.

## 2024-07-08 NOTE — Telephone Encounter (Signed)
 Attempted to call patient with results. No answer x2 and VM box was full. Left VM on listed number under alternate contact number, patient's spouse.   Urine culture shows E. Coli UTI susceptible to macrobid . Would advise patient continue current antibiotic regimen and continue treatment as discussed during our visit.

## 2024-07-08 NOTE — Telephone Encounter (Signed)
 Patient is made aware.

## 2024-07-15 ENCOUNTER — Ambulatory Visit
Admission: EM | Admit: 2024-07-15 | Discharge: 2024-07-15 | Disposition: A | Discharge status: Home / Self Care | Attending: Family Medicine | Admitting: Family Medicine

## 2024-07-15 ENCOUNTER — Encounter: Payer: Self-pay | Admitting: Emergency Medicine

## 2024-07-15 DIAGNOSIS — N390 Urinary tract infection, site not specified: Secondary | ICD-10-CM | POA: Diagnosis not present

## 2024-07-15 DIAGNOSIS — R300 Dysuria: Secondary | ICD-10-CM | POA: Diagnosis present

## 2024-07-15 LAB — POCT URINE DIPSTICK
Glucose, UA: 100 mg/dL — AB
Nitrite, UA: NEGATIVE
Protein Ur, POC: >=300 mg/dL — AB
Spec Grav, UA: >=1.03 — AB (ref 1.010–1.025)
Urobilinogen, UA: 1 U/dL
pH, UA: 6 (ref 5.0–8.0)

## 2024-07-15 MED ORDER — CEPHALEXIN 500 MG PO CAPS: 500.0000 mg | ORAL_CAPSULE | Freq: Two times a day (BID) | ORAL | 0 refills | Status: AC

## 2024-07-15 NOTE — ED Triage Notes (Signed)
 Patient c/o dysuria, urinary urgency and frequency for a couple of days.  Patient did have a dizzy spell last night and fell.  No LOC.  Did not hit her head.  Patient feels it's from the UTI.  Just finished a round of antibiotics for a UTI on Sunday and sx's returned.

## 2024-07-15 NOTE — ED Provider Notes (Signed)
 [facility]    CSN: [id number] Arrival date & time: 07/15/24  1319      History   Chief Complaint Chief Complaint  Patient presents with   Dysuria    HPI CATRENA Flores is a 78 y.o. female.   Patient has a problem with recurring urinary tract infections.  She states that she was just treated for 1 a week and a half ago.  Finished her antibiotics 3 days ago and now has symptoms again.  Burning, frequency, cloudy urine.  She recognizes this is a bladder infection.  No abdominal pain.  No nausea or vomiting.  No fever or chills.  No flank or kidney pain. Patient has COPD and states she has been a little bit more short of breath.  No coughing or chest infection.  She states that she has fluctuations in her breathing seasonally Patient states that she felt lightheaded last night and fell into a doorway.  No serious injury.  She thinks it is from her bladder infection.  She states that urinary tract infections make her feel woozy.  Denies chest pain shortness of breath palpitations, no head injury    Past Medical History:  Diagnosis Date   Arthritis    Breast cancer (HCC)    Cancer (HCC)    ovarian, breast   Chronic bronchitis (HCC)    COPD (chronic obstructive pulmonary disease) (HCC)    Depression    Dyspnea    exertion   Hypertension     Patient Active Problem List   Diagnosis Date Noted   Primary osteoarthritis of right knee 09/03/2018   Fracture of great toe, left, closed 08/05/2018   LIPOMA, SKIN 06/24/2008   Essential hypertension 06/24/2008    Past Surgical History:  Procedure Laterality Date   ABDOMINAL HYSTERECTOMY     BREAST SURGERY     CESAREAN SECTION     CHOLECYSTECTOMY     ELBOW LIGAMENT RECONSTRUCTION Right    KNEE SURGERY     SPLENECTOMY, TOTAL     TONSILLECTOMY     TOTAL KNEE ARTHROPLASTY Right 10/30/2019   Procedure: TOTAL KNEE ARTHROPLASTY;  Surgeon: Yvone Rush, MD;  Location: [facility];  Service: Orthopedics;  Laterality: Right;     OB History   No obstetric history on file.      Home Medications    Prior to Admission medications   Medication Sig Start Date End Date Taking? Authorizing Provider  acetaminophen  (TYLENOL ) 500 MG tablet Take 1,000 mg by mouth every 6 (six) hours as needed for headache.   Yes [provider]  alendronate (FOSAMAX) 70 MG tablet Take 70 mg by mouth once a week. 05/07/24  Yes [provider]  ANORO ELLIPTA  62.5-25 MCG/INH AEPB Inhale 1 puff into the lungs daily. 10/07/19  Yes [provider]  buPROPion  (WELLBUTRIN  XL) 300 MG 24 hr tablet Take 300 mg by mouth daily. 08/11/19  Yes [provider]  calcium carbonate (OS-CAL) 600 MG TABS tablet Take 600 mg by mouth daily.   Yes [provider]  cephALEXin  (KEFLEX ) 500 MG capsule Take 1 capsule (500 mg total) by mouth 2 (two) times daily. 07/15/24  Yes [name], MD  Cholecalciferol (VITAMIN D-3) 125 MCG (5000 UT) TABS Take 5,000 Units by mouth daily.    Yes [provider]  clobetasol cream (TEMOVATE) 0.05 % Apply topically. 04/25/23  Yes [provider]  Fluticasone-Umeclidin-Vilant (TRELEGY ELLIPTA) 100-62.5-25 MCG/ACT AEPB Inhale into the lungs. 03/21/21  Yes [provider]  hydrochlorothiazide  (MICROZIDE ) 12.5 MG capsule Take 12.5 mg by mouth daily.   Yes [provider]  hyoscyamine (LEVSIN) 0.125 MG tablet Take by mouth.   Yes [provider]  LORazepam (ATIVAN) 0.5 MG tablet 1 tablet Orally Once a day for 30 days As needed 01/12/21  Yes [provider]  metoprolol  tartrate (LOPRESSOR ) 25 MG tablet Take 25 mg by mouth in the morning and at bedtime.    Yes [provider]  MULTIPLE VITAMIN PO Take 1 tablet by mouth daily. 50 +   Yes [provider]  MYRBETRIQ  25 MG TB24 tablet Take 25 mg by mouth daily. 09/21/19  Yes [provider]  pantoprazole (PROTONIX) 40 MG tablet 1 tablet 1/2 to 1 hour before morning meal  Orally Once a day 11/07/20  Yes [provider]  Probiotic Product (PROBIOTIC DAILY PO) Take 1 tablet by mouth daily. 15 Billion   Yes [provider]  rosuvastatin (CRESTOR) 5 MG tablet Take 5 mg by mouth at bedtime. 09/06/19  Yes [provider]  STELARA 90 MG/ML SOSY injection Inject into the skin. 03/06/23  Yes [provider]  temazepam (RESTORIL) 30 MG capsule 1 capsule at bedtime as needed Orally Once a day 07/23/23  Yes [provider]  pregabalin (LYRICA) 50 MG capsule 1 capsule Orally twice a day for 30 days 07/30/23 08/29/23  [provider]    Family History Family History  Problem Relation Age of Onset   Heart failure Mother    Lung cancer Father     Social History Social History   Tobacco Use   Smoking status: Former    Current packs/day: 0.50    Types: Cigarettes   Smokeless tobacco: Never  Vaping Use   Vaping status: Former  Substance Use Topics   Alcohol use: Not Currently    Comment: 1/2 case per week    Drug use: No     Allergies   Levaquin [levofloxacin], Morphine and codeine, and Varenicline   Review of Systems Review of Systems See HPI  Physical Exam Triage Vital Signs ED Triage Vitals  Encounter Vitals Group     BP 07/15/24 1414 135/77     Girls Systolic BP Percentile --      Girls Diastolic BP Percentile --      Boys Systolic BP Percentile --      Boys Diastolic BP Percentile --      Pulse Rate 07/15/24 1414 72     Resp 07/15/24 1414 18     Temp 07/15/24 1414 98.2 F (36.8 C)     Temp Source 07/15/24 1414 Oral     SpO2 07/15/24 1414 91 %     Weight --      Height --      Head Circumference --      Peak Flow --      Pain Score 07/15/24 1413 0     Pain Loc --      Pain Education --      Exclude from Growth Chart --    No data found.  Updated Vital Signs BP 135/77 (BP Location: Right Arm)   Pulse 72   Temp 98.2 F (36.8 C) (Oral)   Resp 18   SpO2 91%       Physical  Exam Constitutional:      General: She is not in acute distress.    Appearance: She is well-developed.  HENT:     Head: Normocephalic and  atraumatic.  Eyes:     Conjunctiva/sclera: Conjunctivae normal.     Pupils: Pupils are equal, round, and reactive to light.  Cardiovascular:     Rate and Rhythm: Normal rate and regular rhythm.     Heart sounds: Normal heart sounds.  Pulmonary:     Effort: Pulmonary effort is normal. No respiratory distress.     Breath sounds: Normal breath sounds. No wheezing, rhonchi or rales.  Abdominal:     General: There is no distension.     Palpations: Abdomen is soft.     Tenderness: There is no abdominal tenderness. There is no right CVA tenderness or left CVA tenderness.  Musculoskeletal:        General: Normal range of motion.     Cervical back: Normal range of motion.  Skin:    General: Skin is warm and dry.  Neurological:     Mental Status: She is alert.      UC Treatments / Results  Labs (all labs ordered are listed, but only abnormal results are displayed) Labs Reviewed  POCT URINE DIPSTICK - Abnormal; Notable for the following components:      Result Value   Clarity, UA cloudy (*)    Glucose, UA =100 (*)    Bilirubin, UA small (*)    Ketones, POC UA trace (5) (*)    Spec Grav, UA >=1.030 (*)    Blood, UA large (*)    Protein Ur, POC >=300 (*)    Leukocytes, UA Small (1+) (*)    All other components within normal limits  URINE CULTURE    EKG   Radiology No results found.  Procedures Procedures (including critical care time)  Medications Ordered in UC Medications - No data to display  Initial Impression / Assessment and Plan / UC Course  I have reviewed the triage vital signs and the nursing notes.  Pertinent labs & imaging results that were available during my care of the patient were reviewed by me and considered in my medical decision making (see chart for details).     Discussed urinary antibiotics.  She just  finished Macrodantin  so I do not think that once appropriate.  She states she does well on Cipro, however, I am reluctant to do that with her stated allergy to Levaquin.  I also reviewed her records and she generally Has E. coli on her culture, and has had E. coli resistant to Cipro recently Patient states she does not like Bactrim because it upsets her stomach I therefore chose cephalexin  as an antibiotic.  She knows to check culture in 2 days.  She will be called if any change in antibiotic is needed. Patient has a scheduled follow-up with her PCP in 2 days Final Clinical Impressions(s) / UC Diagnoses   Final diagnoses:  Dysuria  Lower urinary tract infectious disease  Recurrent urinary tract infection     Discharge Instructions      Take the cephalexin  antibiotic 2 times a day Take 2 doses today even though it is not 12 hours apart Drink lots of water  See your doctor as scheduled   ED Prescriptions     Medication Sig Dispense Auth. Provider   cephALEXin  (KEFLEX ) 500 MG capsule Take 1 capsule (500 mg total) by mouth 2 (two) times daily. 20 capsule [name], MD      PDMP not reviewed this encounter.   [name], MD 07/15/24 1435

## 2024-07-15 NOTE — Discharge Instructions (Signed)
 Take the cephalexin  antibiotic 2 times a day Take 2 doses today even though it is not 12 hours apart Drink lots of water  See your doctor as scheduled

## 2024-07-15 NOTE — Telephone Encounter (Signed)
 Left message for pt to return call or we will call back.

## 2024-07-15 NOTE — Telephone Encounter (Signed)
 If she is having trouble urinating she needs to go to UC. Would not recommend that she leave a sample as she needs in person evaluation to discuss her symptoms in more detail

## 2024-07-15 NOTE — Telephone Encounter (Signed)
 Patient is calling back because the phone was disconnected.  Patient is wanting to get a refill on the UTI medication due to still having symptoms.  Urgency Unable to hold urine Burning  Patient stated that she has been having dizziness today but had a fall this morning at 3:30AM and hit head on the shower.  Reached back out to the clinical staff and patient was transferred to [facility] for further assistance.  Call Back Number: [phone]

## 2024-07-15 NOTE — Telephone Encounter (Signed)
 Copied from CRM #[id number]. Topic: Clinical Concerns - Medical Question >> Jul 15, 2024 12:19 PM Roselie NOVAK wrote: Ryla, [place] is calling other request    Include all details related to the request(s) below:  Patient just has a recent video visit 07-06-24 for UTI, she has finished the antibiotic 07-12-24.  Patient refused to come in for visit too busy.  The patient is just wanted more antibiotic call in.  Patient stated during the conversation she is really dizzy and had fell last night hit head on shower door.    Due to this new information decided to transfer to [facility], was unable to connect with any staff patient disconnected call, try to call back received her voice mail informed her that she would received a call back.   Confirm and type the Best Contact Number below:  Patient/caller contact number:          [phone]   [] Home  [x] Mobile  [] Work [] Other   [x] Okay to leave a voicemail   Medication List:  Current Outpatient Medications:  .  acetaminophen  (TYLENOL ) 500 mg tablet, Take 1,000 mg by mouth every 6 (six) hours as needed (pain)., Disp: , Rfl:  .  albuterol  HFA (Ventolin  HFA) 90 mcg/actuation inhaler, Inhale 2 puffs every 6 (six) hours as needed (wheezing)., Disp: , Rfl:  .  albuterol  sulfate 2.5 mg/0.5 mL nebu nebulizer solution, Take 0.5 mL (2.5 mg total) by nebulization every 6 (six) hours as needed for wheezing., Disp: 15 mL, Rfl: 11 .  alendronate (FOSAMAX) 70 mg tablet, Take 1 tablet (70 mg total) by mouth every 7 days. In morning w/ full glass of water  on an empty stomach,don't lie down x30 Min, Disp: 12 tablet, Rfl: 4 .  buPROPion  (WELLBUTRIN  XL) 300 mg 24 hr tablet, Take 300 mg by mouth daily., Disp: , Rfl:  .  calcium carb and citrate-vitD3 600 mg-12.5 mcg (500 unit) TbER, Take 1 tablet by mouth Once Daily., Disp: , Rfl:  .  cholecalciferol (VITAMIN D3) 2,000 unit cap capsule, Take 2,000 Units by mouth Once Daily., Disp: , Rfl:  .  clobetasoL  (TEMOVATE) 0.05 % cream, Apply 1 Application topically 2 (two) times a day., Disp: , Rfl:  .  gabapentin  (NEURONTIN ) 600 mg tablet, Take 1 tablet (600 mg total) by mouth 3 (three) times a day., Disp: 270 tablet, Rfl: 3 .  hydroCHLOROthiazide  (HYDRODIURIL ) 12.5 mg capsule, Take 12.5 mg by mouth every morning., Disp: , Rfl:  .  hyoscyamine (LEVSIN) 0.125 mg tablet, 1 TABLET TWICE A DAY AS NEEDED FOR CRAMPING, Disp: , Rfl: 3 .  [employer] devi, TAKE 1 EACH BY NEBULIZATION AS NEEDED (SHORTNESS OF BREATH, WHEEZING). USE AS DIRECTED., Disp: 1 each, Rfl: 0 .  lactobacillus (Culturelle), Take 1 capsule by mouth Once Daily., Disp: , Rfl:  .  losartan (COZAAR) 25 mg tablet, Take 1 tablet (25 mg total) by mouth daily., Disp: 90 tablet, Rfl: 0 .  metoprolol  tartrate (LOPRESSOR ) 25 mg tablet, Take 25 mg by mouth 2 (two) times a day., Disp: , Rfl:  .  mirabegron  (Myrbetriq ) 50 mg Tb24, Take 1 tablet (50 mg total) by mouth daily., Disp: 90 tablet, Rfl: 1 .  miscellaneous medical supply misc, Use as directed, Disp: 1 each, Rfl: 0 .  multivitamin (THERAGRAN) tab tablet, Take 1 tablet by mouth Once Daily., Disp: , Rfl:  .  pantoprazole (PROTONIX) 40 mg EC tablet, Take 40 mg by mouth Once Daily., Disp: , Rfl:  .  rosuvastatin (CRESTOR) 20 mg tablet, Take 1 tablet (20 mg total) by mouth daily., Disp: 90 tablet, Rfl: 3 .  senna leaf-herbal no.324 (Senokot-Chamomile) 1,400 mg- 1,100 mg tea, Take by mouth as needed., Disp: , Rfl:  .  temazepam (RESTORIL) 22.5 mg capsule, Take 1 capsule (22.5 mg total) by mouth nightly as needed for sleep., Disp: 30 capsule, Rfl: 0 .  Trelegy Ellipta 100-62.5-25 mcg inhaler, Inhale 1 puff Once Daily., Disp: , Rfl:  .  ustekinumab (STELARA) 45 mg/0.5 mL injection, Inject 45 mg under the skin every 8 (eight) weeks., Disp: , Rfl:      Medication Request/Refills: Pharmacy Information (if applicable)   [] Not Applicable       [x]  Pharmacy listed  Send Medication Request to:     [facility]                                                  [] Pharmacy not listed (added to pharmacy list in Epic) Send Medication Request to:      Listed Pharmacies: [id number] - [place], [place] - [address] - PHONE: [phone] - FAX: [phone] [facility]

## 2024-07-16 LAB — URINE CULTURE: Special Requests: NORMAL

## 2024-07-21 ENCOUNTER — Ambulatory Visit (HOSPITAL_COMMUNITY): Payer: Self-pay
